# Patient Record
Sex: Female | Born: 1975 | Race: White | Hispanic: No | Smoking: Never smoker
Health system: Southern US, Community
[De-identification: ages and names within clinical notes are randomized; demographics above are authoritative.]

## PROBLEM LIST (undated history)

## (undated) ENCOUNTER — Inpatient Hospital Stay (HOSPITAL_COMMUNITY): Payer: Self-pay

## (undated) DIAGNOSIS — E069 Thyroiditis, unspecified: Secondary | ICD-10-CM

## (undated) DIAGNOSIS — R87629 Unspecified abnormal cytological findings in specimens from vagina: Secondary | ICD-10-CM

## (undated) DIAGNOSIS — N760 Acute vaginitis: Principal | ICD-10-CM

## (undated) DIAGNOSIS — I10 Essential (primary) hypertension: Secondary | ICD-10-CM

## (undated) DIAGNOSIS — T7840XA Allergy, unspecified, initial encounter: Secondary | ICD-10-CM

## (undated) DIAGNOSIS — J309 Allergic rhinitis, unspecified: Secondary | ICD-10-CM

## (undated) DIAGNOSIS — B9689 Other specified bacterial agents as the cause of diseases classified elsewhere: Secondary | ICD-10-CM

## (undated) DIAGNOSIS — R1011 Right upper quadrant pain: Secondary | ICD-10-CM

## (undated) DIAGNOSIS — R319 Hematuria, unspecified: Secondary | ICD-10-CM

## (undated) DIAGNOSIS — K219 Gastro-esophageal reflux disease without esophagitis: Secondary | ICD-10-CM

## (undated) DIAGNOSIS — K5909 Other constipation: Secondary | ICD-10-CM

## (undated) DIAGNOSIS — E042 Nontoxic multinodular goiter: Secondary | ICD-10-CM

## (undated) DIAGNOSIS — E049 Nontoxic goiter, unspecified: Secondary | ICD-10-CM

## (undated) HISTORY — DX: Acute vaginitis: N76.0

## (undated) HISTORY — DX: Thyroiditis, unspecified: E06.9

## (undated) HISTORY — DX: Other specified bacterial agents as the cause of diseases classified elsewhere: B96.89

## (undated) HISTORY — DX: Allergic rhinitis, unspecified: J30.9

## (undated) HISTORY — DX: Unspecified abnormal cytological findings in specimens from vagina: R87.629

## (undated) HISTORY — DX: Nontoxic multinodular goiter: E04.2

## (undated) HISTORY — DX: Essential (primary) hypertension: I10

## (undated) HISTORY — DX: Nontoxic goiter, unspecified: E04.9

## (undated) HISTORY — DX: Allergy, unspecified, initial encounter: T78.40XA

## (undated) HISTORY — DX: Other constipation: K59.09

## (undated) HISTORY — DX: Gastro-esophageal reflux disease without esophagitis: K21.9

## (undated) HISTORY — DX: Hematuria, unspecified: R31.9

## (undated) HISTORY — DX: Right upper quadrant pain: R10.11

---

## 2002-10-07 ENCOUNTER — Ambulatory Visit (HOSPITAL_COMMUNITY): Admission: AD | Admit: 2002-10-07 | Discharge: 2002-10-07 | Payer: Self-pay | Admitting: Obstetrics and Gynecology

## 2002-10-16 ENCOUNTER — Ambulatory Visit (HOSPITAL_COMMUNITY): Admission: AD | Admit: 2002-10-16 | Discharge: 2002-10-16 | Payer: Self-pay | Admitting: Obstetrics and Gynecology

## 2002-10-17 ENCOUNTER — Inpatient Hospital Stay (HOSPITAL_COMMUNITY): Admission: AD | Admit: 2002-10-17 | Discharge: 2002-10-19 | Payer: Self-pay | Admitting: Obstetrics and Gynecology

## 2003-05-13 ENCOUNTER — Ambulatory Visit (HOSPITAL_COMMUNITY): Admission: RE | Admit: 2003-05-13 | Discharge: 2003-05-13 | Payer: Self-pay | Admitting: Urology

## 2004-06-07 HISTORY — PX: ENDOMETRIAL ABLATION: SHX621

## 2004-06-07 HISTORY — PX: TUBAL LIGATION: SHX77

## 2004-12-17 ENCOUNTER — Ambulatory Visit (HOSPITAL_COMMUNITY): Admission: RE | Admit: 2004-12-17 | Discharge: 2004-12-17 | Payer: Self-pay | Admitting: Family Medicine

## 2004-12-18 ENCOUNTER — Ambulatory Visit: Payer: Self-pay | Admitting: Cardiology

## 2005-07-08 ENCOUNTER — Ambulatory Visit (HOSPITAL_COMMUNITY): Admission: RE | Admit: 2005-07-08 | Discharge: 2005-07-08 | Payer: Self-pay | Admitting: Obstetrics and Gynecology

## 2005-09-28 ENCOUNTER — Ambulatory Visit: Payer: Self-pay | Admitting: Internal Medicine

## 2005-10-05 ENCOUNTER — Ambulatory Visit: Payer: Self-pay | Admitting: Internal Medicine

## 2005-10-05 ENCOUNTER — Ambulatory Visit (HOSPITAL_COMMUNITY): Admission: RE | Admit: 2005-10-05 | Discharge: 2005-10-05 | Payer: Self-pay | Admitting: Internal Medicine

## 2006-04-21 ENCOUNTER — Ambulatory Visit (HOSPITAL_COMMUNITY): Admission: RE | Admit: 2006-04-21 | Discharge: 2006-04-21 | Payer: Self-pay | Admitting: Obstetrics and Gynecology

## 2006-07-28 ENCOUNTER — Ambulatory Visit (HOSPITAL_COMMUNITY): Admission: RE | Admit: 2006-07-28 | Discharge: 2006-07-28 | Payer: Self-pay | Admitting: Obstetrics and Gynecology

## 2006-08-18 ENCOUNTER — Encounter: Payer: Self-pay | Admitting: Endocrinology

## 2007-03-03 ENCOUNTER — Ambulatory Visit (HOSPITAL_COMMUNITY): Admission: RE | Admit: 2007-03-03 | Discharge: 2007-03-03 | Payer: Self-pay | Admitting: Urology

## 2008-02-09 ENCOUNTER — Other Ambulatory Visit: Admission: RE | Admit: 2008-02-09 | Discharge: 2008-02-09 | Payer: Self-pay | Admitting: Obstetrics and Gynecology

## 2008-02-26 IMAGING — US US ABDOMEN COMPLETE
1 series · 14 of 25 positions shown · non-contrast
Comparison: none

CLINICAL DATA: Elevated liver function test.
 ABDOMINAL ULTRASOUND:
TECHNIQUE: Complete abdominal ultrasound examination was performed including evaluation of the liver, gallbladder, bile ducts, pancreas, kidneys, spleen, IVC, and abdominal aorta.

[Series 1: unknown · 0.33mm/px · 14 of 69 slices shown]
[im 1/69]
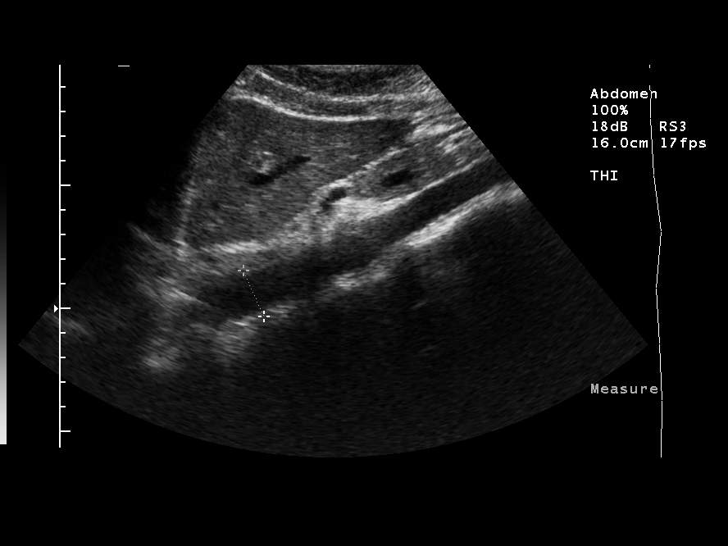
[im 6/69]
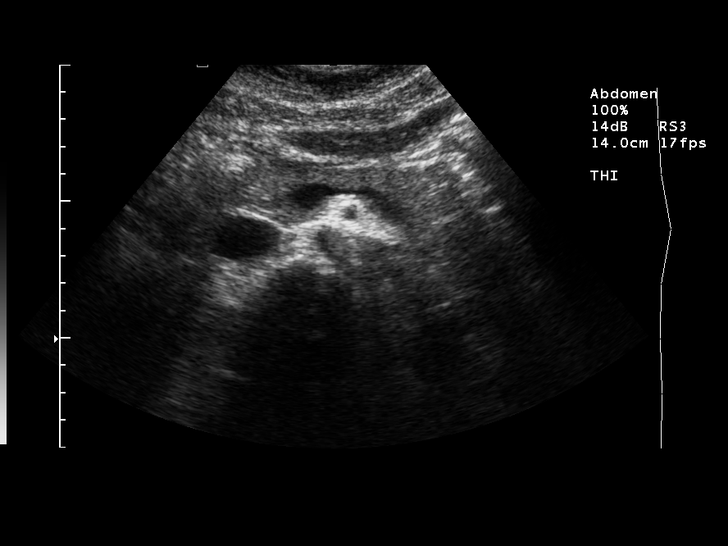
[im 12/69]
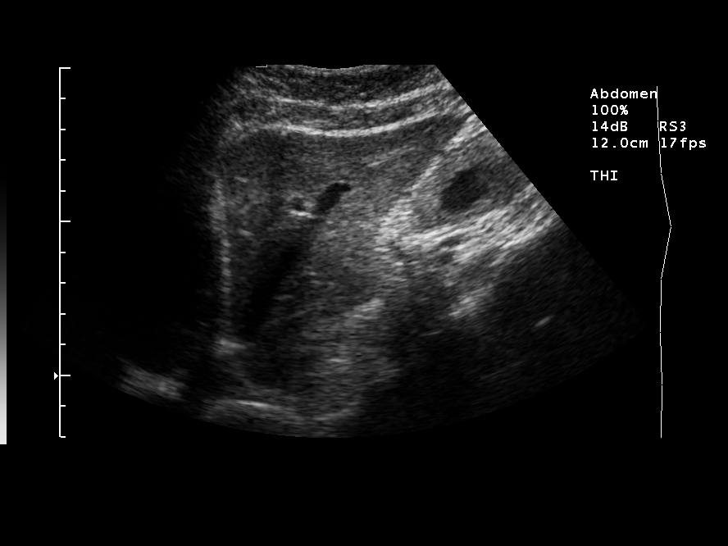
[im 18/69]
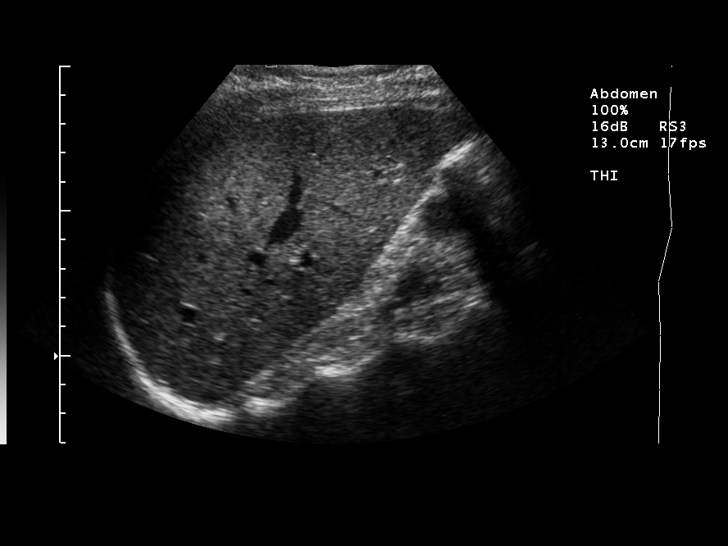
[im 23/69]
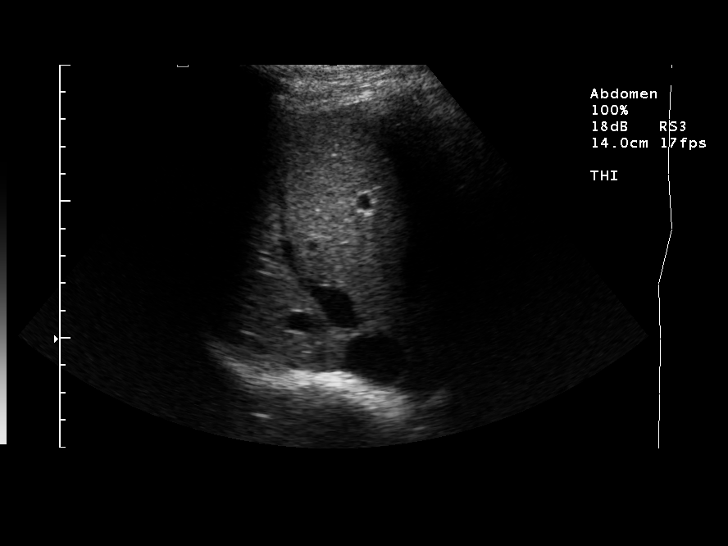
[im 26/69]
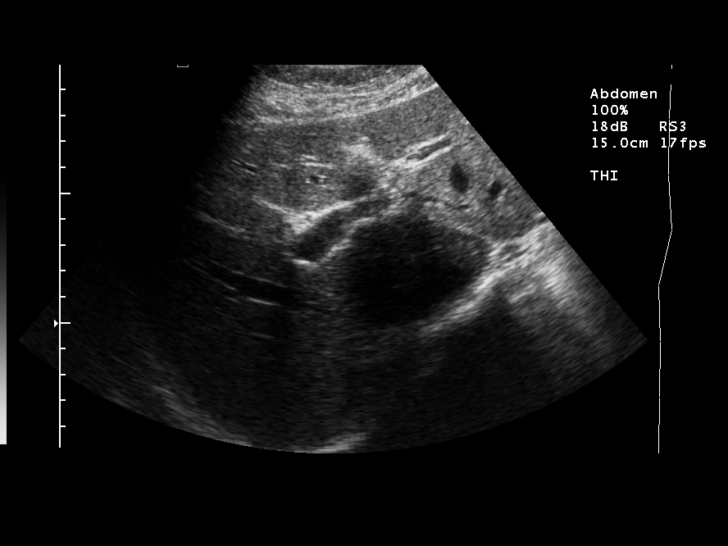
[im 32/69]
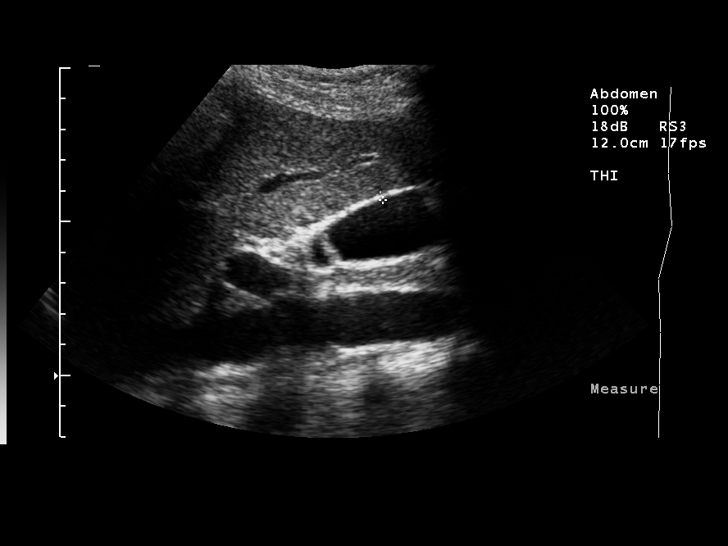
[im 37/69]
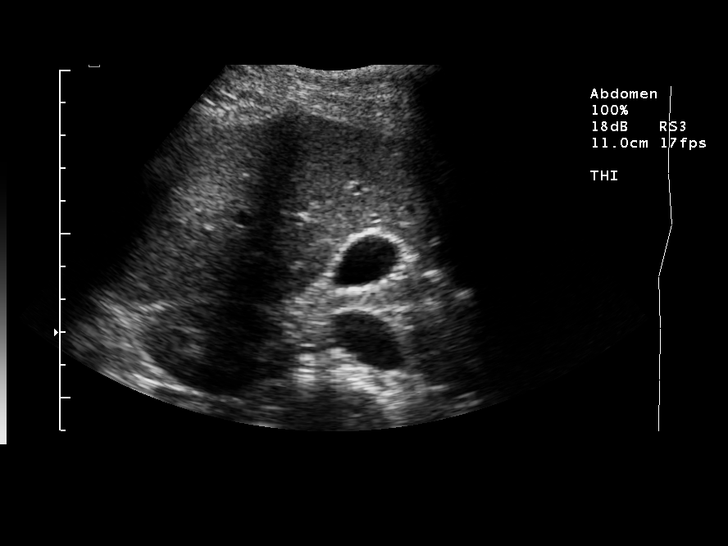
[im 43/69]
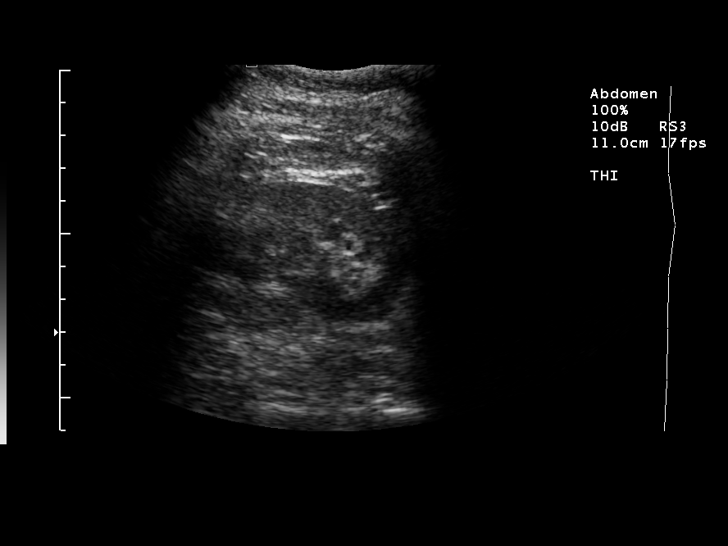
[im 46/69]
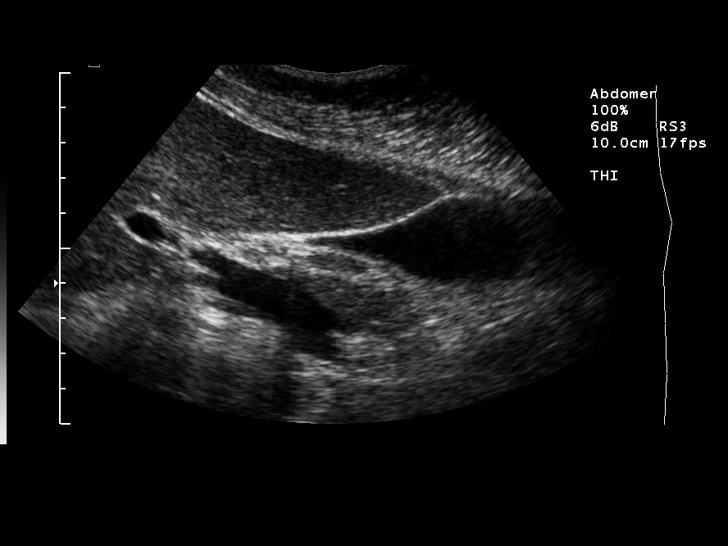
[im 52/69]
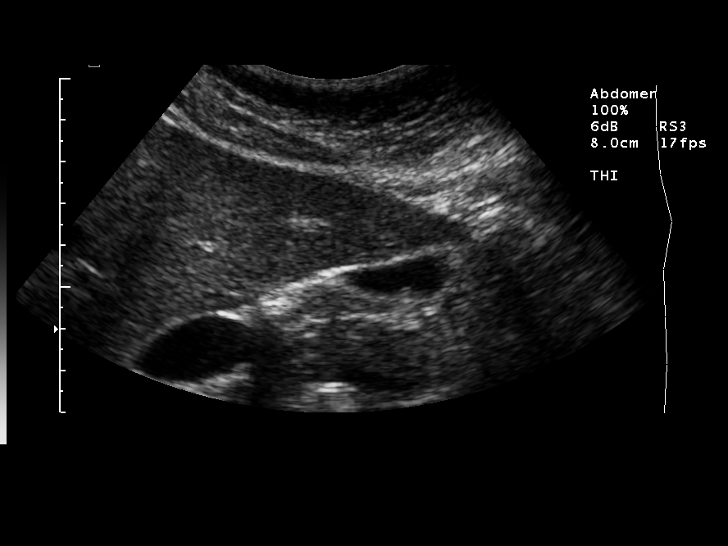
[im 57/69]
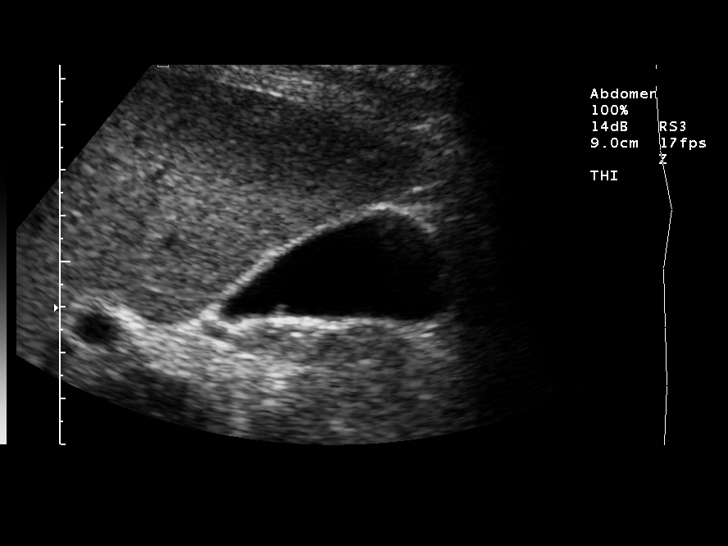
[im 63/69]
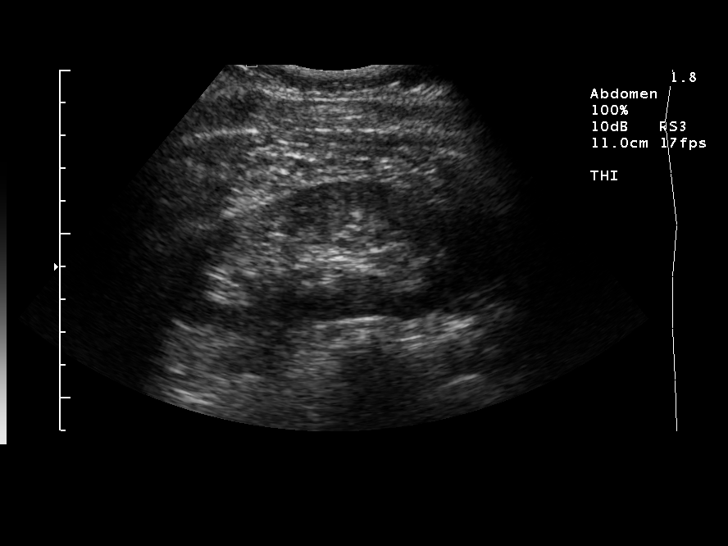
[im 69/69]
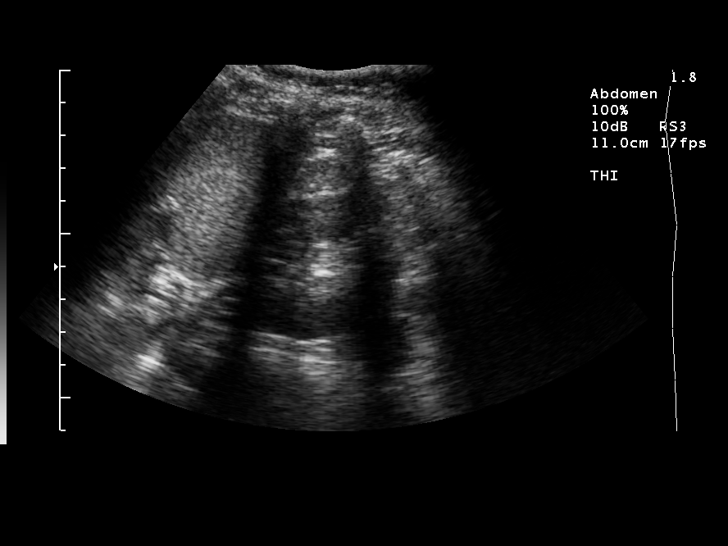

[14 of 25 positions shown; findings below may reference images not displayed]

FINDINGS: There is a small cholesterol polyp within the gallbladder measuring 3mm in diameter.  The gallbladder wall thickness is normal and there is no evidence for cholelithiasis.  There is no fluid surrounding the gallbladder.  The common bile duct is normal in caliber measuring 2mm in diameter.  The liver is normal in size and there are no focal hepatic abnormalities.  The inferior vena cava, pancreas, spleen, kidneys and abdominal aorta have a normal appearance with the right kidney measuring 9.4cm in length and the left kidney 10.6cm in length.  There is no ascites.
IMPRESSION: Small polyp noted within the gallbladder (incidental finding).  Otherwise normal study.

## 2008-09-19 ENCOUNTER — Encounter: Payer: Self-pay | Admitting: Endocrinology

## 2009-11-05 ENCOUNTER — Encounter: Payer: Self-pay | Admitting: Endocrinology

## 2009-11-12 ENCOUNTER — Encounter: Payer: Self-pay | Admitting: Endocrinology

## 2009-11-26 ENCOUNTER — Encounter: Payer: Self-pay | Admitting: Endocrinology

## 2009-12-03 ENCOUNTER — Encounter: Payer: Self-pay | Admitting: Endocrinology

## 2010-06-05 ENCOUNTER — Other Ambulatory Visit
Admission: RE | Admit: 2010-06-05 | Discharge: 2010-06-05 | Payer: Self-pay | Source: Home / Self Care | Admitting: Obstetrics and Gynecology

## 2010-06-11 ENCOUNTER — Other Ambulatory Visit: Payer: Self-pay | Admitting: Endocrinology

## 2010-06-11 ENCOUNTER — Ambulatory Visit
Admission: RE | Admit: 2010-06-11 | Discharge: 2010-06-11 | Payer: Self-pay | Source: Home / Self Care | Attending: Endocrinology | Admitting: Endocrinology

## 2010-06-11 DIAGNOSIS — J309 Allergic rhinitis, unspecified: Secondary | ICD-10-CM

## 2010-06-11 DIAGNOSIS — E069 Thyroiditis, unspecified: Secondary | ICD-10-CM

## 2010-06-11 DIAGNOSIS — E042 Nontoxic multinodular goiter: Secondary | ICD-10-CM | POA: Insufficient documentation

## 2010-06-11 HISTORY — DX: Thyroiditis, unspecified: E06.9

## 2010-06-11 HISTORY — DX: Nontoxic multinodular goiter: E04.2

## 2010-06-11 HISTORY — DX: Allergic rhinitis, unspecified: J30.9

## 2010-06-11 LAB — TSH: TSH: 1.25 u[IU]/mL (ref 0.35–5.50)

## 2010-07-09 NOTE — Letter (Signed)
Summary: French Ana MD  French Ana MD   Imported By: Sherian Rein 06/18/2010 12:42:46  _____________________________________________________________________  External Attachment:    Type:   Image     Comment:   External Document

## 2010-07-09 NOTE — Assessment & Plan Note (Signed)
Summary: NEW ENDO/ASHIMOTOTHYROIDITIS/4 BIOPSIED NODULES/SECOND OPINIO...   Vital Signs:  Patient profile:   35 year old female Height:      64 inches (162.56 cm) Weight:      124.38 pounds (56.54 kg) BMI:     21.43 O2 Sat:      95 % on Room air Temp:     98.8 degrees F (37.11 degrees C) oral Pulse rate:   76 / minute Pulse rhythm:   regular BP sitting:   108 / 72  (left arm) Cuff size:   regular  Vitals Entered By: Brenton Grills CMA Duncan Dull) (June 11, 2010 8:47 AM)  O2 Flow:  Room air CC: New Endo Consult/Hashimoto's Thyroiditis/2nd opinion/Self referral Is Patient Diabetic? No   Primary Provider:  Lubertha South MD  CC:  New Endo Consult/Hashimoto's Thyroiditis/2nd opinion/Self referral.  History of Present Illness: pt was noted by a freind in 2007, to have a goiter.  she was noted on ultrasound to have a multinodular goiter.  she was rx'ed synthroid, but this caused fatigue and tremor.  she stopped it.  she had bx of bilat nodules in 2010.  pt says she can notice the enlarged thyroid. pt states few mos of moderate tremor of the hands, and assoc anxiety.  Current Medications (verified): 1)  Astepro 0.15 % Soln (Azelastine Hcl) .Marland Kitchen.. 1 Puff Into Each Nostril Every Morniing 2)  Nasonex 50 Mcg/act Susp (Mometasone Furoate) .Marland Kitchen.. 1 Puff Into Each Nostril Once Daily 3)  Claritin 10 Mg Tabs (Loratadine) .Marland Kitchen.. 1 Tablet By Mouth Once Daily  Allergies (verified): No Known Drug Allergies  Past History:  Past Medical History: ALLERGIC RHINITIS (ICD-477.9) THYROIDITIS (ICD-245.9) GOITER, MULTINODULAR (ICD-241.1) FAMILY HISTORY DIABETES 1ST DEGREE RELATIVE (ICD-V18.0) FAMILY HISTORY OF ALCOHOLISM/ADDICTION (ICD-V61.41)  Past Surgical History: Tubal Ligation (2006) endometrial ablation (for menorrhagia), also 2006  Family History: Reviewed history and no changes required. Family History of Alcoholism/Addiction Family History Diabetes 1st degree relative Family History  Hypertension Family History Of Stroke (Parent) Family History of Colon Cancer (Grandparent) Family History of Breast Cancer (Grandparent) great aunt had a huge goiter brother had overactive thyroid (?reason, never had rx)  Social History: Reviewed history and no changes required. Never Smoked Alcohol use-no Drug use-no Regular exercise-no Married nurse at [employer]  Smoking Status:  never Drug Use:  no Does Patient Exercise:  no Seat Belt Use:  yes  Review of Systems       denies sob and dysphagia.  she repoorts difficulty with concentration, leg weakness, constipation, rhinorrhea, and easy bruising.  she has lost a few lbs.  she reports intermittent hypoglycemia.  she has short but reg menses.  she denies headache, hoarseness, double vision, palpitations, polyuria, myalgias, excessive diaphoresis, and numbness.  Physical Exam  General:  normal appearance.   Head:  head: no deformity eyes: no periorbital swelling, no proptosis external nose and ears are normal mouth: no lesion seen Neck:  thyroid is slightly enlarged, with irreg surface, but i do not appreciate a discrete nodule Lungs:  Clear to auscultation bilaterally. Normal respiratory effort.  Heart:  Regular rate and rhythm without murmurs or gallops noted. Normal S1,S2.   Msk:  muscle bulk and strength are grossly normal.  no obvious joint swelling.  gait is normal and steady  Extremities:  no deformity no edema Neurologic:  cn 2-12 grossly intact.   readily moves all 4's.   sensation is intact to touch on the feet there is a slight tremor of the hands Skin:  normal texture and temp.  no rash is visible.  not diaphoretic  Cervical Nodes:  No significant adenopathy.  Psych:  Alert and cooperative; normal mood and affect; normal attention span and concentration.   Additional Exam:  outside test results are reviewed:  i reviewed thyroid cytologies from 2011, and multiple ultrasounds.   i also reviewed tft.   tpo and  anti-tg antibodies are pos.     Impression & Recommendations:  Problem # 1:  GOITER, MULTINODULAR (ICD-241.1) usually hereditary  Problem # 2:  THYROIDITIS (ICD-245.9) uncertain how or if this is related to #1.  she may have 2 thyroid diseases  Problem # 3:  hypoglycemia this is a risk fact for diabetes  Medications Added to Medication List This Visit: 1)  Astepro 0.15 % Soln (Azelastine hcl) .Marland Kitchen.. 1 puff into each nostril every morniing 2)  Nasonex 50 Mcg/act Susp (Mometasone furoate) .Marland Kitchen.. 1 puff into each nostril once daily 3)  Claritin 10 Mg Tabs (Loratadine) .Marland Kitchen.. 1 tablet by mouth once daily  Other Orders: TLB-TSH (Thyroid Stimulating Hormone) (84443-TSH) Consultation Level IV (16109)  Patient Instructions: 1)  blood tests are being ordered for you today.  please call [phone] to hear your test results. 2)  most of the time, a "lumpy thyroid" will eventually become overactive.  this is usually a slow process, happening over the span of many years. 3)  however, you also have imflamation of the thyroid, which will probably cause an underactive thyroid, again usually over a period of many years.   4)  therefore, you should have your thyroid blood test checked at least twice a year.  i would be happy to do this, or it could be done by dr Gerda Diss or dr Emelda Fear.   5)  cc drs luking ferguson, and Millard.   Orders Added: 1)  TLB-TSH (Thyroid Stimulating Hormone) [84443-TSH] 2)  Consultation Level IV [81191]   Immunization History:  Tetanus/Td Immunization History:    Tetanus/Td:  historical (06/07/2006)   Immunization History:  Tetanus/Td Immunization History:    Tetanus/Td:  Historical (06/07/2006)

## 2010-07-09 NOTE — Letter (Signed)
Summary: French Ana MD  French Ana MD   Imported By: Sherian Rein 06/18/2010 12:51:03  _____________________________________________________________________  External Attachment:    Type:   Image     Comment:   External Document

## 2010-09-23 ENCOUNTER — Other Ambulatory Visit (HOSPITAL_COMMUNITY): Payer: Self-pay | Admitting: Family Medicine

## 2010-09-23 DIAGNOSIS — M79604 Pain in right leg: Secondary | ICD-10-CM

## 2010-09-23 DIAGNOSIS — R200 Anesthesia of skin: Secondary | ICD-10-CM

## 2010-09-23 DIAGNOSIS — M5430 Sciatica, unspecified side: Secondary | ICD-10-CM

## 2010-09-29 ENCOUNTER — Ambulatory Visit (HOSPITAL_COMMUNITY)
Admission: RE | Admit: 2010-09-29 | Discharge: 2010-09-29 | Disposition: A | Discharge status: Home / Self Care | Payer: BC Managed Care – PPO | Source: Ambulatory Visit | Attending: Family Medicine | Admitting: Family Medicine

## 2010-09-29 DIAGNOSIS — M79604 Pain in right leg: Secondary | ICD-10-CM

## 2010-09-29 DIAGNOSIS — R200 Anesthesia of skin: Secondary | ICD-10-CM

## 2010-09-29 DIAGNOSIS — M5430 Sciatica, unspecified side: Secondary | ICD-10-CM | POA: Insufficient documentation

## 2010-10-23 NOTE — Op Note (Signed)
Kristi, Medina              ACCOUNT NO.:  [id number]   MEDICAL RECORD NO.:  [id number]          PATIENT TYPE:  AMB   LOCATION:  DAY                           FACILITY:  [facility]   PHYSICIAN:  Tilda Burrow, M.D. DATE OF BIRTH:  Nov 10, 1975   DATE OF PROCEDURE:  07/08/2005  DATE OF DISCHARGE:                                 OPERATIVE REPORT   PREOPERATIVE DIAGNOSES:  1.  Desire for elective sterilization.  2.  Menorrhagia.   POSTOPERATIVE DIAGNOSES:  1.  Desire for elective sterilization.  2.  Menorrhagia.   PROCEDURE:  Elective permanent sterilization by Falope ring application,  hysteroscopy, endometrial ablation.   SURGEON:  Christin Bach, M.D.   ASSISTANT:  None.   ANESTHESIA:  General.   COMPLICATIONS:  None.   FINDINGS:  Small uterus, anteflexed, sounded to 7 cm. Normal appearing tubes  and ovaries bilaterally.   DETAILS OF PROCEDURE:  The patient was taken to the operating room, prepped  and draped for combined abdominal and vaginal procedures. In-and-out  catheterization of the bladder was performed, and Hulka tenaculum attached  to the cervix for uterine  manipulation. An infraumbilical, vertical skin  incision was made in the naval, hidden well within the naval, and a Veress  needle used to achieve pneumoperitoneum after water droplet test confirmed  intraperitoneal placement. Particular care was taken to orient the needle  toward the pelvis due to the patient's very slim body build. We achieved  pneumoperitoneum easily with 3 liters CO2, then introduced the 5-mm  laparoscopic trocar through the umbilicus under direct visualization, then  developed a second suprapubic trocar site using an 8-mm trocar. This was  inserted under direct visualization, and attention directed to the pelvis  where photos showed the normal appearing pelvis, with two Falope rings  applied in standard fashion to each tube with Marcaine solution used  percutaneously to inject into  the incarcerated knuckle of tube and into the  underlying broad ligament. Procedure was successfully achieved, and 200 cc  saline instilled into the abdominal cavity to help with deflation of the  abdomen. Subcuticular 4-0 Dexon closure of the skin allowed completion of  the procedure.   We then proceeded to the vaginal portion of the case. At this time, a  Grave's speculum was inserted in the vagina. Cervix grasped with single-  toothed tenaculum, sounded to 7 cm, dilated to 25-French, allowing  introduction of the 23-mm, 30-degree rigid hysteroscope. Hysteroscopy  revealed normal uterine internal contours without endometrial thickening or  any polyps. Curettage was not considered necessary. Endometrial ablation was  then performed using  the [employer]ne[employer] ThermaChoice balloon using a total of 12 cc of fluid in a  standard 8-minute heating cycle. All of the fluid retrieved after the  cooling cycle completed, and the patient then allowed to waken and go to the  recovery room in good condition. Sponge and needle counts were correct  throughout.      Tilda Burrow, M.D.  Electronically Signed     JVF/MEDQ  D:  07/09/2005  T:  07/10/2005  Job:  [id number]

## 2010-10-23 NOTE — H&P (Signed)
NAME:  Kristi Medina, Kristi Medina                        ACCOUNT NO.:  [id number]   MEDICAL RECORD NO.:  [id number]                   PATIENT TYPE:   LOCATION:                                       FACILITY:  [facility]   PHYSICIAN:  Tilda Burrow, M.D.              DATE OF BIRTH:  January 05, 1976   DATE OF ADMISSION:  10/17/2002  DATE OF DISCHARGE:                                HISTORY & PHYSICAL   REASON FOR ADMISSION:  Pregnancy at 38 weeks for induction of labor.   HISTORY OF PRESENT ILLNESS:  The patient is a 35 year old, gravida 2, para  1, with an EDC of Nov 01, 2002, who is being followed in the office with  lack of growth starting at 34 weeks and a bout of preterm labor.  She has  had little growth during the course of the past five to six weeks.  She has  also within the past two weeks had a decrease in amniotic fluid volume from  16.9 to 12.8 and increasing calcification in the placenta and decreasing  fetal movement, but a reassuring NST.  After discussing the risk of  respiratory distress syndrome due to prematurity in a 38-week infant and all  of the other risks and benefits and weighing all of the pros and cons, the  patient feels strongly about being delivered, so we are going to put her in  for Pitocin induction of labor.   PAST MEDICAL HISTORY:  Positive for chronic hematuria.   PAST SURGICAL HISTORY:  Negative.   ALLERGIES:  She has seasonal allergies.   MEDICATIONS:  She takes Zyrtec, Tylenol, and prenatal vitamins.   SOCIAL HISTORY:  She is married.  She is a Engineer, civil (consulting) at [employer].  Her  husband is supportive and they have a stable relationship.   FAMILY HISTORY:  Positive for diabetes, hypertension, stroke, breast cancer,  and colon rupture.   PRENATAL COURSE:  Essentially uneventful up until April when she had  premature labor and premature cervical dilatation.  At that time, she was  given betamethasone 12 mg IM in 24-hour increments x 2 doses.  Her blood  type is A positive.  Rubella is immune.  Hepatitis B surface negative.  HIV  is negative.  Serology is nonreactive.  Pap is within normal limits.  GC and  chlamydia are negative.  She declined AFP.  GBS is negative.  The 28-week  hemoglobin was 11.6, hematocrit 35.7, and one-hour glucose 37.   PHYSICAL EXAMINATION:  WEIGHT:  155 pounds.  VITAL SIGNS:  Blood pressure 124/72.  ABDOMEN:  The fundal height is approximately 32 cm.  Fetal movements are  decreased, but NST is reassuring.  PELVIC:  The cervix is 2 cm, 90% effaced, and 0 station.   PLAN:  We are going to admit for Pitocin induction of labor.     Zerita Boers, N.M.  Tilda Burrow, M.D.    DL/MEDQ  D:  16/03/9603  T:  10/16/2002  Job:  [id number]   cc:   [facility]

## 2010-10-23 NOTE — Op Note (Signed)
   NAME:  Kristi Medina, Kristi Medina                        ACCOUNT NO.:  [id number]   MEDICAL RECORD NO.:  [id number]                   PATIENT TYPE:  INP   LOCATION:  LDR1                                 FACILITY:  [facility]   PHYSICIAN:  Tilda Burrow, M.D.              DATE OF BIRTH:  02/17/1976   DATE OF PROCEDURE:  DATE OF DISCHARGE:                                  PROCEDURE NOTE   ONSET OF LABOR:  Oct 17, 2002.   LENGTH OF FIRST STAGE OF LABOR:  1 hour and 45 minutes.   LENGTH OF SECOND STAGE OF LABOR:  38 minutes.   LENGTH OF THIRD STAGE OF LABOR:  7 minutes.   DELIVERY NOTE:  The patient had a normal spontaneous vaginal delivery of a  viable female infant weighing 5 pounds 6 ounces under epidural anesthesia.  Upon delivery of the head, the infant was rotated and delivered easily,  thoroughly suctioned, and dried.  The cord was clamped and cut.  Placed on  the mother's abdomen in good condition with good movement of all  extremities, a vigorous cry, and color was pink.  The third stage of labor  was actively managed with 20 units of Pitocin and 1000 mL of LR and  prophylaxis of Hemabate 0.5 amp IM for uterine atony.  The estimated blood  loss was approximately 350 mL.  The placenta was delivered spontaneously via  Schultz's mechanism.  A three-vessel cord was noted upon inspection.  Membranes were noted to be intact upon inspection.  The placenta was very  aged with many calcifications.  The placenta was sent to pathology for  inspection.  The mother and infant tolerated the procedure well.  The  epidural catheter was removed with the blue tip intact.  The patient and  infant were both stabilized and transferred out to the postpartum unit in  good condition.     Zerita Boers, Reita Cliche, M.D.    DL/MEDQ  D:  16/03/9603  T:  10/17/2002  Job:  [id number]   cc:   [facility]

## 2010-10-23 NOTE — H&P (Signed)
NAMEJENEVIEVE, Kristi Medina              ACCOUNT NO.:  [id number]   MEDICAL RECORD NO.:  [id number]          PATIENT TYPE:  AMB   LOCATION:  DAY                           FACILITY:  [facility]   PHYSICIAN:  Tilda Burrow, M.D. DATE OF BIRTH:  07/19/1975   DATE OF ADMISSION:  07/08/2005  DATE OF DISCHARGE:  LH                                HISTORY & PHYSICAL   ADMISSION DIAGNOSES:  Desire for elective sterilization, menorrhagia,  scheduled for tubal ligation and endometrial ablation.   HISTORY OF PRESENT ILLNESS:  This 35 year old female gravida 2, para 2, LMP  June 16, 2005 is admitted at this time for endometrial ablation and tubal  sterilization. Kristi Medina has two children, 11 and 2, no plans for future child  bearing. She has 7-day menses which are a source of discomfort, headaches,  when not on birth control pills. Depo-Provera caused unacceptable weight  gain. Birth control pills resulted in headache. Nuva ring was attempted  resulted in chronic cervicitis. There is no desire for more children.  Resolution of menses is an important part of her desired treatment.  Laparoscopic tubal sterilization is reviewed with failure rate of 1 in 100  quoted to the patient. Endometrial ablation is reviewed. The patient was  medically knowledgeable and completely familiar with the procedure.   PAST MEDICAL HISTORY:  Positive for chronic microscopic hematuria.   PAST SURGICAL HISTORY:  Negative.   ALLERGIES:  Seasonal allergies only.   MEDICATIONS:  Zyrtec p.r.n.   PHYSICAL EXAMINATION:  GENERAL:  Shows a healthy-appearing, slim, Caucasian  female. Weight 129, blood pressure 117/70.  HEENT:  Pupils are equal, round, and reactive. Extraocular movements intact.  NECK:  Supple.  CHEST:  Clear to auscultation.  ABDOMEN:  Nontender.  EXTERNAL GENITALIA:  Multiparous.  VAGINA:  Normal secretions.  CERVIX:  Recent Pap smear Class I August 2006.   PLAN:  Hysteroscopy D&C, endometrial ablation, with  tubal sterilization to  be performed July 08, 2005 using ThermaChoice balloon endometrial  ablation device.      Tilda Burrow, M.D.  Electronically Signed     JVF/MEDQ  D:  07/01/2005  T:  07/01/2005  Job:  [id number]   cc:   [facility]  Fax: [phone]

## 2010-10-23 NOTE — Consult Note (Signed)
NAME:  Kristi Medina, Kristi Medina              ACCOUNT NO.:  [id number]   MEDICAL RECORD NO.:  [id number]          PATIENT TYPE:  AMB   LOCATION:  DAY                           FACILITY:  [facility]   PHYSICIAN:  R. Roetta Sessions, M.D. DATE OF BIRTH:  04-08-1976   DATE OF CONSULTATION:  09/28/2005  DATE OF DISCHARGE:  07/08/2005                                   CONSULTATION   REASON FOR CONSULTATION:  Hemoccult-positive stool.   HISTORY OF PRESENT ILLNESS:  Ms. 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  Incidentally, she checked her hemoglobin at the  office.  She was found to have a hemoglobin of 11.3 about two months ago.  She then took home four Hemoccult cards, all of which were positive.  She  then scheduled an appointment to be seen by [place].  In the interim, she  developed nausea, vomiting, and watery diarrhea for about four days last  week.  However, prior to the Hemoccult, she denies any problems with this.  This was self limited.  She has had no further abdominal pain, nausea,  vomiting, or diarrhea.  She does complain of occasional dull ache  retrosternally.  She has felt the problems could be related to her heart.  However, she had an EKG which showed PACs.  She denies any gross rectal  bleeding or melena.  She generally has a soft brown bowel movement once or  twice a day.  Denies any problems with chronic constipation or diarrhea.  She denies any history of hemorrhoids, proctalgia, pruritus.  She denies any  heartburn, indigestion, dysphagia, or odynophagia.  Denies any anorexia or  early satiety.   Hemoglobin from September 14, 2005, was 14.4, hematocrit 45.4, platelets 188,  and WBC 5.2.  Sodium 139, potassium 4.1, chloride 103, CO2 28, glucose 81,  BUN 18, creatinine 0.8.  Total bilirubin 0.4.  Alkaline phosphatase 55.  AST  elevated at 38, ALT elevated at 80.  Total protein 7.9, albumin 4.8, calcium  10.1.  Previously, her liver function tests have been  normal except for  mildly elevated ALT at 55 on August 25, 2005.  She had a H. pylori serology  which was negative.  She reports to me that she had a positive hepatitis A  antibody, hepatitis B immunity, and negative HCV antibody through Dr.  Rayna Sexton office.  She denies any history of jaundice, clay-colored stools,  darkening of her urine, or pruritus.   PAST MEDICAL HISTORY:  Chronic microscopic hematuria, tubal ligation,  endometrial ablation in February 2007, PACs.   CURRENT MEDICATIONS:  Multivitamin daily, ibuprofen rarely.   ALLERGIES:  No known drug allergies.   FAMILY HISTORY:  Positive for maternal grandmother diagnosed with colon  cancer in her 50s.  Mother age 85 with history of adenomatous polyps.  Father deceased at age 43 secondary to complications of diabetes mellitus.  She has multiple siblings, one with history significant for hypertension.   SOCIAL HISTORY:  Ms. Lomeli has been married for 11 years.  She has a 67-  year-old and an 35 year old who reside at home.  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.  She denies any tobacco, alcohol, or drug  use.   REVIEW OF SYSTEMS:  CONSTITUTIONAL:  Weight has been stable.  Denies any  fever or chills.  Denies any anorexia.  She does complain of some fatigue.  CARDIOVASCULAR:  Denies any chest pain or palpitation.  RESPIRATORY:  Denies  any cough or hemoptysis.  GI:  See HPI.  ENDOCRINE:  She denies any history  of thyroid disease or diabetes mellitus.  HEMATOLOGICAL:  She has had some  easy bruising.  Denies any epistaxis or history of bleeding disorders.  She  does note she had bleeding after her endometrial ablation and was seen by  Dr. Emelda Fear.  She had mild bleeding, two episodes since the ablation.  He,  apparently, has done some workup regarding bleeding disorders which have all  come back negative per her report.   PHYSICAL EXAMINATION:  VITAL SIGNS:  Weight 126 pounds.  Height 64 inches.  Temp  98.2, blood pressure 114/66, pulse 84.  GENERAL:  Ms. Candella is a 35 year old Caucasian female who is alert,  oriented, cooperative, in no acute distress.  HEENT:  Head normocephalic and atraumatic.  Oropharynx clear with moist  without lesions.  NECK:  Supple without mass or thyromegaly.  CHEST:  Heart regular rate and rhythm.  Normal S1 and S2 without murmurs,  rubs, or gallops.  LUNGS:  Clear to auscultation bilaterally.  ABDOMEN:  Positive bowel sounds x 4.  No bruits auscultated.  Soft,  nontender, nondistended without palpable mass or hepatosplenomegaly.  No  rebound, tenderness, or guarding.  RECTAL:  Deferred.  EXTREMITIES:  Without clubbing or edema bilaterally.  SKIN:  Warm and dry without any rash or jaundice.   ASSESSMENT:  Ms. Donlan is a 35 year old Caucasian female who was  recently found to have Hemoccult-positive stool on four cards through Dr.  Rayna Sexton office.  She denies any gross GI bleeding.  Denies any long-  standing GI complaints including heartburn, indigestion, nausea, vomiting,  anorexia, rectal bleeding, constipation or diarrhea.  She has had a mild  fleeting, retrosternal, aching, atypical chest pain intermittently.  I  suspect her bleeding is due to benign anorectal source such as internal  hemorrhoids.  She also could have a silent peptic ulcer disease.  She has a  family history of adenomatous polyps and maternal grandmother with colon  cancer.  Therefore, I feel we should proceed with colonoscopy plus/minus EGD  for further evaluation.   She is also noted to have new-onset transaminitis.  She had a mildly  elevated ALT of 55 a month ago and is twice normal currently.  She also has  had an elevation in her AST at 38.  She has had hepatitis panel which showed  a positive hepatitis A previous exposure and negative for hepatitis B and C.  She is going to need an ultrasound to evaluate her liver and further workup based on trend of her LFTs.    PLAN:  1.  Abdominal ultrasound.  2.  LFTs rechecked May 03/2006, through Dr. Rayna Sexton office.  3.  Colonoscopy plus EGD with Dr. Jena Gauss in the near future.  I have      discussed the procedure, risks and benefits      which include but not limited to bleeding, infection, perforation, drug      reaction.  She agrees and consent will be obtained.   We would like to thank Dr. Emelda Fear  for allowing [place] to participate in the  care of Ms. Pederson.      Nicholas Lose, N.P.      Jonathon Bellows, M.D.  Electronically Signed    KC/MEDQ  D:  09/28/2005  T:  09/29/2005  Job:  [id number]   cc:   Tilda Burrow, M.D.  Fax: [phone]   Mila Homer. Sherlean Foot, M.D.  Fax: [phone]

## 2010-10-23 NOTE — Op Note (Signed)
NAME:  Kristi Medina, Kristi Medina              ACCOUNT NO.:  [id number]   MEDICAL RECORD NO.:  [id number]          PATIENT TYPE:  AMB   LOCATION:  DAY                           FACILITY:  [facility]   PHYSICIAN:  R. Roetta Sessions, M.D. DATE OF BIRTH:  Sep 14, 1975   DATE OF PROCEDURE:  10/05/2005  DATE OF DISCHARGE:                                 OPERATIVE REPORT   PROCEDURE:  Colonoscopy with ileoscopy, diagnostic, followed by  esophagogastroduodenoscopy, diagnostic.   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  There is no family history of colorectal cancer neoplasia.  Colonoscopy is now being done.  Ms. [name] understands that if  colonoscopy is unrevealing, we will perform an EGD.  Risks, benefits and  alternatives have been reviewed and questions answered.  She is agreeable.  Please see documentation on the medical record.   PROCEDURE NOTE:  O2 saturation, blood pressure, pulses, and respirations  were monitored throughout both procedures.  Conscious sedation with Versed 6  mg IV and Demerol 125 mg IV in divided doses.  Phenergan 12.5 mg IV.  Cetacaine spray for topical oropharyngeal anesthesia.   INSTRUMENT:  Olympus video chip system.   FINDINGS:  Digital rectal exam revealed no abnormalities.   ENDOSCOPIC FINDINGS:  Prep was good.   RECTUM:  Examination of the rectal mucosa, including retroflexion of the  anal verge, revealed no abnormalities.   COLON:  The colonic mucosa was surveyed from the rectosigmoid junction  through the left transverse, right colon, the appendiceal orifice, the  ileocecal valve, and cecum.  These structures were well seen and  photographed for the record.  The terminal ileum was intubated to 10 cm.  From this level, the scope was slowly withdrawn.  All previously mentioned  mucosal surfaces were again seen.  The colonic mucosa appeared  entirely  normal, as did the terminal ileum mucosa.  The patient tolerated the  procedure well and was prepared for EGD.   FINDINGS:  Examination of the tubular esophagus revealed no mucosal  abnormalities.  The EG junction was easily traversed.   STOMACH:  The gastric cavity was empty and insufflated well with air.  A  thorough examination of the gastric mucosa, including retroflexed view of  the proximal stomach and esophagogastric junction, demonstrated multiple  areas of the erosions and actually __________ geographic distribution along  the greater curvature, please see photos.  There is no frank ulcer or  infiltrating process, but there was some small adherent clot on these  erosions.  Please see photos.  The pylorus is patent and easily traversed.  Examination of the bulb and second portion revealed no abnormalities.   THERAPEUTICS/DIAGNOSTIC MANEUVERS PERFORMED:  None.   Patient tolerated the procedure well and was reactive.   ENDOSCOPY IMPRESSION:  1.  Normal rectum, terminal ileum.  2.  Esophagogastroduodenoscopy findings:  Normal esophagus.  Multiple      hemorrhagic erosions in the stomach (most likely nonsteroidal-related),      otherwise normal gastric mucosa, normal D1/D2.  The patient's hemoccult positive stools are almost certainly related to  NSAIDs insult to her stomach.  Occult lesions further down in the small  bowel related to NSAIDs are not excluded at this time.   RECOMMENDATIONS:  1.  Stop all forms of NSAIDs, including ibuprofen, etc.  2.  H. pylori serologies.  3.  Separate issue.  The patient has a history of transaminitis.  Will      evaluate further.  Will have her come back to see [place] in three months and      will check a CBC and LFTs just prior to her office visit.      Jonathon Bellows, M.D.  Electronically Signed     RMR/MEDQ  D:  10/05/2005  T:  10/05/2005  Job:  [id number]   cc:   Tilda Burrow, M.D.  Fax: [phone]

## 2010-10-23 NOTE — Op Note (Signed)
   NAME:  Kristi Medina, Kristi Medina                        ACCOUNT NO.:  [id number]   MEDICAL RECORD NO.:  [id number]                   PATIENT TYPE:  OIB   LOCATION:  A414                                 FACILITY:  [facility]   PHYSICIAN:  Lazaro Arms, M.D.                DATE OF BIRTH:  Oct 15, 1975   DATE OF PROCEDURE:  10/17/2002  DATE OF DISCHARGE:  10/16/2002                                 OPERATIVE REPORT   PROCEDURE:  Epidural placement.   INDICATIONS FOR PROCEDURE:  Joline is a gravida 2, para 1, [redacted] weeks gestation,  asking for an epidural be placed at 6 cm.   DESCRIPTION OF PROCEDURE:  The patient was placed in a sitting position.  The iliac crests were identified, spinous processes were identified and  marked.  The L2-L3, and L3-L4 interspaces were marked.  Betadine prep was  used; 1% lidocaine was injected at the L2-L3 interspace.  A 17-gauge Touhy  needle was used and with 1 pass an epidural was placed without difficulty.  The epidural catheter was put into a depth of 5 cm, or 10 cm at the skin.  She was given 10 cc of 0.125% bupivacaine test dose.  That was followed  another 10 cc to dose it up and followed by the continuous pump at 12 cc an  hour.  The patient was given an additional 5 cc of 1.5% lidocaine with  epinephrine to get her level up quickly.  The patient tolerated it well and  was comfortable after the procedure.                                               Lazaro Arms, M.D.    Loraine Maple  D:  10/25/2002  T:  10/25/2002  Job:  [id number]

## 2010-11-19 ENCOUNTER — Other Ambulatory Visit: Payer: Self-pay | Admitting: Neurology

## 2010-11-19 DIAGNOSIS — R202 Paresthesia of skin: Secondary | ICD-10-CM

## 2010-11-19 DIAGNOSIS — R200 Anesthesia of skin: Secondary | ICD-10-CM

## 2010-11-27 ENCOUNTER — Ambulatory Visit (HOSPITAL_COMMUNITY)
Admission: RE | Admit: 2010-11-27 | Discharge: 2010-11-27 | Disposition: A | Discharge status: Home / Self Care | Payer: BC Managed Care – PPO | Source: Ambulatory Visit | Attending: Neurology | Admitting: Neurology

## 2010-11-27 DIAGNOSIS — R209 Unspecified disturbances of skin sensation: Secondary | ICD-10-CM | POA: Insufficient documentation

## 2010-11-27 DIAGNOSIS — J320 Chronic maxillary sinusitis: Secondary | ICD-10-CM | POA: Insufficient documentation

## 2010-11-27 DIAGNOSIS — R200 Anesthesia of skin: Secondary | ICD-10-CM

## 2010-11-27 DIAGNOSIS — R202 Paresthesia of skin: Secondary | ICD-10-CM

## 2010-11-27 DIAGNOSIS — J322 Chronic ethmoidal sinusitis: Secondary | ICD-10-CM | POA: Insufficient documentation

## 2010-11-27 MED ORDER — GADOBENATE DIMEGLUMINE 529 MG/ML IV SOLN
10.0000 mL | Freq: Once | INTRAVENOUS | Status: AC | PRN
  Administered 2010-11-27: 10 mL via INTRAVENOUS

## 2010-12-07 ENCOUNTER — Other Ambulatory Visit (INDEPENDENT_AMBULATORY_CARE_PROVIDER_SITE_OTHER): Payer: BC Managed Care – PPO

## 2010-12-07 ENCOUNTER — Encounter: Payer: Self-pay | Admitting: Endocrinology

## 2010-12-07 ENCOUNTER — Ambulatory Visit (INDEPENDENT_AMBULATORY_CARE_PROVIDER_SITE_OTHER): Payer: BC Managed Care – PPO | Admitting: Endocrinology

## 2010-12-07 VITALS — BP 108/70 | HR 73 | Temp 98.7°F | Ht 64.0 in | Wt 125.1 lb

## 2010-12-07 DIAGNOSIS — E042 Nontoxic multinodular goiter: Secondary | ICD-10-CM

## 2010-12-07 LAB — TSH: TSH: 1.44 u[IU]/mL (ref 0.35–5.50)

## 2010-12-07 NOTE — Patient Instructions (Addendum)
blood tests are being ordered for you today.  please call [phone] to hear your test results.  You will be prompted to enter the 9-digit "MRN" number that appears at the top left of this page, followed by #.  Then you will hear the message. Please make a follow-up appointment in 6 months. 

## 2010-12-07 NOTE — Progress Notes (Signed)
  Subjective:    Patient ID: Kristi Medina, female    DOB: 03-18-1976, 35 y.o.   MRN: [id number]  HPI Pt returns for f/u of thyroiditis and multinodular goiter.  pt states she feels well in general, except for slight tingling of the legs.  Past Medical History  Diagnosis Date  . GOITER, MULTINODULAR 06/11/2010  . THYROIDITIS 06/11/2010  . ALLERGIC RHINITIS 06/11/2010    Past Surgical History  Procedure Date  . Tubal ligation 2006  . Endometrial ablation 2006    for menorrhagia    History   Social History  . Marital Status: Married    Spouse Name: N/A    Number of Children: N/A  . Years of Education: N/A   Occupational History  . Nurse [employer]   Social History Main Topics  . Smoking status: Never Smoker   . Smokeless tobacco: Not on file  . Alcohol Use: No  . Drug Use: No  . Sexually Active:    Other Topics Concern  . Not on file   Social History Narrative   Regular exercise-no    Current Outpatient Prescriptions on File Prior to Visit  Medication Sig Dispense Refill  . DISCONTD: azelastine (ASTELIN) 137 MCG/SPRAY nasal spray Place 1 spray into the nose every morning. Use in each nostril as directed       . DISCONTD: loratadine (CLARITIN) 10 MG tablet Take 10 mg by mouth daily.        Marland Kitchen DISCONTD: mometasone (NASONEX) 50 MCG/ACT nasal spray Place 1 spray into the nose daily.          No Known Allergies  Family History  Problem Relation Age of Onset  . Thyroid disease Brother     had overactive thyroid  . Goiter Other     [employer]  . Alcohol abuse Other   . Diabetes Other     1st degree Relative  . Hypertension Other   . Stroke Other     Parent  . Cancer Other     Breast Cancer-Grandparent  . Cancer Other     Colon Cancer-Grandparent    BP 108/70  Pulse 73  Temp(Src) 98.7 F (37.1 C) (Oral)  Ht 5\' 4"  (1.626 m)  Wt 125 lb 1.9 oz (56.754 kg)  BMI 21.48 kg/m2  SpO2 97%  LMP 11/18/2010    Review of Systems Denies weight change.     Objective:    Physical Exam GENERAL: no distress Thyroid: lightly prominent on the left, with ? Of 1 cm nodule at the mid-left lobe     Assessment & Plan:  With these 2 thyroid probs, she is at risk for either hyper- or hypothyroidism, over time

## 2011-06-14 ENCOUNTER — Encounter: Payer: Self-pay | Admitting: Endocrinology

## 2011-06-14 ENCOUNTER — Other Ambulatory Visit (INDEPENDENT_AMBULATORY_CARE_PROVIDER_SITE_OTHER): Payer: BC Managed Care – PPO

## 2011-06-14 ENCOUNTER — Ambulatory Visit (INDEPENDENT_AMBULATORY_CARE_PROVIDER_SITE_OTHER): Payer: BC Managed Care – PPO | Admitting: Endocrinology

## 2011-06-14 VITALS — BP 102/74 | HR 77 | Temp 97.9°F | Ht 63.5 in | Wt 126.0 lb

## 2011-06-14 DIAGNOSIS — E069 Thyroiditis, unspecified: Secondary | ICD-10-CM

## 2011-06-14 NOTE — Progress Notes (Signed)
  Subjective:    Patient ID: Kristi Medina, female    DOB: 1976/03/05, 36 y.o.   MRN: [id number]  HPI Pt returns for f/u of thyroiditis and multinodular goiter (2007).  she was rx'ed synthroid, but this caused fatigue and tremor.  she stopped it.  she had bx of bilat nodules in 2010.  pt states she feels well in general, except for slight constipation.  She does not notice the nodules.   Past Medical History  Diagnosis Date  . GOITER, MULTINODULAR 06/11/2010  . THYROIDITIS 06/11/2010  . ALLERGIC RHINITIS 06/11/2010    Past Surgical History  Procedure Date  . Tubal ligation 2006  . Endometrial ablation 2006    for menorrhagia    History   Social History  . Marital Status: Married    Spouse Name: N/A    Number of Children: N/A  . Years of Education: N/A   Occupational History  . Nurse [employer]   Social History Main Topics  . Smoking status: Never Smoker   . Smokeless tobacco: Not on file  . Alcohol Use: No  . Drug Use: No  . Sexually Active:    Other Topics Concern  . Not on file   Social History Narrative   Regular exercise-no    Current Outpatient Prescriptions on File Prior to Visit  Medication Sig Dispense Refill  . fexofenadine (ALLEGRA) 180 MG tablet Take 180 mg by mouth daily.          No Known Allergies  Family History  Problem Relation Age of Onset  . Thyroid disease Brother     had overactive thyroid  . Goiter Other     [employer]  . Alcohol abuse Other   . Diabetes Other     1st degree Relative  . Hypertension Other   . Stroke Other     Parent  . Cancer Other     Breast Cancer-Grandparent  . Cancer Other     Colon Cancer-Grandparent    BP 102/74  Pulse 77  Temp(Src) 97.9 F (36.6 C) (Oral)  Ht 5' 3.5" (1.613 m)  Wt 126 lb (57.153 kg)  BMI 21.97 kg/m2  SpO2 98%  LMP 06/07/2011  Review of Systems Denies weight change.      Objective:   Physical Exam VITAL SIGNS:  See vs page GENERAL: no distress Thyroid:  There are 2 palpable  nodules.  One at the mid-right, and 1 at the upper left.       Assessment & Plan:  Multinodular goiter, in a pt with h/o high-titer antibodies.  She is at risk for hyper- or hypothyroidism (probably the latter). She declines f/u ultrasound for now.

## 2011-06-14 NOTE — Patient Instructions (Addendum)
blood tests are being ordered for you today.  please call [phone] to hear your test results.  You will be prompted to enter the 9-digit "MRN" number that appears at the top left of this page, followed by #.  Then you will hear the message. Please make a follow-up appointment in 1 year.   Cc dr Drucie Ip.

## 2011-06-24 ENCOUNTER — Other Ambulatory Visit: Payer: Self-pay | Admitting: Adult Health

## 2011-06-24 DIAGNOSIS — N6320 Unspecified lump in the left breast, unspecified quadrant: Secondary | ICD-10-CM

## 2011-07-14 ENCOUNTER — Other Ambulatory Visit: Payer: Self-pay | Admitting: Adult Health

## 2011-07-14 ENCOUNTER — Ambulatory Visit (HOSPITAL_COMMUNITY)
Admission: RE | Admit: 2011-07-14 | Discharge: 2011-07-14 | Disposition: A | Discharge status: Home / Self Care | Payer: BC Managed Care – PPO | Source: Ambulatory Visit | Attending: Adult Health | Admitting: Adult Health

## 2011-07-14 DIAGNOSIS — N63 Unspecified lump in unspecified breast: Secondary | ICD-10-CM | POA: Insufficient documentation

## 2011-07-14 DIAGNOSIS — N6320 Unspecified lump in the left breast, unspecified quadrant: Secondary | ICD-10-CM

## 2012-05-26 ENCOUNTER — Encounter (INDEPENDENT_AMBULATORY_CARE_PROVIDER_SITE_OTHER): Payer: Self-pay | Admitting: General Surgery

## 2012-05-26 ENCOUNTER — Ambulatory Visit (INDEPENDENT_AMBULATORY_CARE_PROVIDER_SITE_OTHER): Payer: BC Managed Care – PPO | Admitting: General Surgery

## 2012-05-26 VITALS — BP 96/62 | HR 85 | Temp 98.0°F | Ht 63.75 in | Wt 118.8 lb

## 2012-05-26 DIAGNOSIS — N6019 Diffuse cystic mastopathy of unspecified breast: Secondary | ICD-10-CM

## 2012-05-26 NOTE — Patient Instructions (Signed)

## 2012-05-26 NOTE — Progress Notes (Signed)
Patient ID: Kristi Medina, female   DOB: 1976-02-04, 36 y.o.   MRN: [id number]  Chief Complaint  Patient presents with  . Pre-op Exam    eval Lt br cyst    HPI Kristi Medina is a 36 y.o. female.  Referred by Dr. Emelda Fear HPI This is a 36 year old female who is otherwise very healthy who presents with a history of left breast tenderness. She has a family history her maternal grandmother breast cancer. She has had no prior history of any breast problems herself. 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 These areas have been present for about a year. She thinks they are 2 left breast cysts one of which is at 9:00 and one of which is about 6:00. She has undergone an evaluation with an ultrasound as well as mammography in February 2013 additional fibroglandular tissue. Since then she notes that these areas have had some more discomfort associated with them and have begun to bother her a lot more in terms of their tenderness. She does drink some caffeine. She really has no other real risk factors for breast pain right now. Past Medical History  Diagnosis Date  . GOITER, MULTINODULAR 06/11/2010  . THYROIDITIS 06/11/2010  . ALLERGIC RHINITIS 06/11/2010    Past Surgical History  Procedure Date  . Tubal ligation 2006  . Endometrial ablation 2006    for menorrhagia    Family History  Problem Relation Age of Onset  . Thyroid disease Brother     had overactive thyroid  . Goiter Other     [employer]  . Alcohol abuse Other   . Diabetes Other     1st degree Relative  . Hypertension Other   . Stroke Other     Parent  . Cancer Other     Breast Cancer-Grandparent  . Cancer Other     Colon Cancer-Grandparent  . Hypertension Mother   . Diabetes Father   . Stroke Father   . Hypertension Father     Social History History  Substance Use Topics  . Smoking status: Never Smoker   . Smokeless tobacco: Not on file  . Alcohol Use: No    No Known Allergies  Current Outpatient  Prescriptions  Medication Sig Dispense Refill  . citalopram (CELEXA) 10 MG tablet Take 5 mg by mouth daily.      . fexofenadine (ALLEGRA) 180 MG tablet Take 180 mg by mouth daily.          Review of Systems Review of Systems  Constitutional: Negative for fever, chills and unexpected weight change.  HENT: Negative for hearing loss, congestion, sore throat, trouble swallowing and voice change.   Eyes: Negative for visual disturbance.  Respiratory: Negative for cough and wheezing.   Cardiovascular: Negative for chest pain, palpitations and leg swelling.  Gastrointestinal: Negative for nausea, vomiting, abdominal pain, diarrhea, constipation, blood in stool, abdominal distention and anal bleeding.  Genitourinary: Negative for hematuria, vaginal bleeding and difficulty urinating.  Musculoskeletal: Negative for arthralgias.  Skin: Negative for rash and wound.  Neurological: Negative for seizures, syncope and headaches.  Hematological: Negative for adenopathy. Does not bruise/bleed easily.  Psychiatric/Behavioral: Negative for confusion.    Blood pressure 96/62, pulse 85, temperature 98 F (36.7 C), temperature source Temporal, height 5' 3.75" (1.619 m), weight 118 lb 12.8 oz (53.887 kg), SpO2 99.00%.  Physical Exam Physical Exam  Vitals reviewed. Constitutional: She appears well-developed and well-nourished.  Pulmonary/Chest: Left breast exhibits tenderness. Left breast exhibits  no mass and no nipple discharge.    Lymphadenopathy:    She has no cervical adenopathy.    She has no axillary adenopathy.       Left: No supraclavicular adenopathy present.    Data Reviewed *RADIOLOGY REPORT*  Clinical Data: The patient and her physician feels a lump in the 9  o'clock position of the left breast. She also feels a lump in the  6 o'clock position of the left breast.  DIGITAL DIAGNOSTIC BILATERAL MAMMOGRAM WITH CAD AND LEFT BREAST  ULTRASOUND:  Comparison: None.  Findings: The breast  tissue is heterogeneously dense. There is no  dominant mass, architectural distortion or calcification to suggest  malignancy.  Mammographic images were processed with CAD.  On physical exam, no mass is palpated in the left breast.  Ultrasound is performed, showing mixed fibroglandular tissue and  fat in the areas of concern to the patient at 6 and 9 o'clock. No  mass, distortion or shadowing to suggest malignancy is identified.  IMPRESSION:  No mammographic or sonographic evidence of malignancy. Yearly  screening mammography should begin at age 29 unless clinically  indicated earlier.  BI-RADS CATEGORY 1: Negative.   Assessment    Fibrocystic changes left breast    Plan    I don't really identify a discrete mass on my examination today. She has an ultrasound from February that is fairly normal as well. She does however have some more symptoms associated with this. We discussed all of the different options including continued observation, repeat ultrasound, or surgery. I do not think these areas need to be excised. I told her additionally I don't know that it would help all the symptoms that she has. These areas are hard to identify as well. I think be reasonable to proceed with the middle options she would be more comfortable fat and repeat her ultrasound. If this is normal again then we can continue treating her for mastalgia. I will call her with the ultrasound is done.       Kristi Medina 05/26/2012, 9:49 AM

## 2012-06-01 ENCOUNTER — Other Ambulatory Visit (INDEPENDENT_AMBULATORY_CARE_PROVIDER_SITE_OTHER): Payer: Self-pay | Admitting: General Surgery

## 2012-06-01 ENCOUNTER — Ambulatory Visit
Admission: RE | Admit: 2012-06-01 | Discharge: 2012-06-01 | Disposition: A | Discharge status: Home / Self Care | Payer: BC Managed Care – PPO | Source: Ambulatory Visit | Attending: General Surgery | Admitting: General Surgery

## 2012-06-01 DIAGNOSIS — N6019 Diffuse cystic mastopathy of unspecified breast: Secondary | ICD-10-CM

## 2012-06-13 ENCOUNTER — Ambulatory Visit (INDEPENDENT_AMBULATORY_CARE_PROVIDER_SITE_OTHER): Payer: BC Managed Care – PPO | Admitting: Endocrinology

## 2012-06-13 ENCOUNTER — Encounter: Payer: Self-pay | Admitting: Endocrinology

## 2012-06-13 VITALS — BP 112/68 | HR 75 | Temp 97.5°F | Wt 122.0 lb

## 2012-06-13 DIAGNOSIS — E042 Nontoxic multinodular goiter: Secondary | ICD-10-CM

## 2012-06-13 NOTE — Progress Notes (Signed)
  Subjective:    Patient ID: Kristi Medina, female    DOB: Jul 19, 1975, 37 y.o.   MRN: [id number]  HPI Pt returns for f/u of thyroiditis and multinodular goiter (dx'ed 2007).  she was rx'ed synthroid, but this caused fatigue and tremor.  she stopped it.  she had bxs of bilat nodules in 2010.  pt states she feels well in general, except for slight constipation.  She does not notice the nodules.  pt states she feels well in general, except for depression.  This is improved with celexa.   Past Medical History  Diagnosis Date  . GOITER, MULTINODULAR 06/11/2010  . THYROIDITIS 06/11/2010  . ALLERGIC RHINITIS 06/11/2010    Past Surgical History  Procedure Date  . Tubal ligation 2006  . Endometrial ablation 2006    for menorrhagia    History   Social History  . Marital Status: Married    Spouse Name: N/A    Number of Children: N/A  . Years of Education: N/A   Occupational History  . Nurse [employer]   Social History Main Topics  . Smoking status: Never Smoker   . Smokeless tobacco: Not on file  . Alcohol Use: No  . Drug Use: No  . Sexually Active:    Other Topics Concern  . Not on file   Social History Narrative   Regular exercise-no    Current Outpatient Prescriptions on File Prior to Visit  Medication Sig Dispense Refill  . citalopram (CELEXA) 10 MG tablet Take 5 mg by mouth daily.      . fexofenadine (ALLEGRA) 180 MG tablet Take 180 mg by mouth daily.          No Known Allergies  Family History  Problem Relation Age of Onset  . Thyroid disease Brother     had overactive thyroid  . Goiter Other     [employer]  . Alcohol abuse Other   . Diabetes Other     1st degree Relative  . Hypertension Other   . Stroke Other     Parent  . Cancer Other     Breast Cancer-Grandparent  . Cancer Other     Colon Cancer-Grandparent  . Hypertension Mother   . Diabetes Father   . Stroke Father   . Hypertension Father     BP 112/68  Pulse 75  Temp 97.5 F (36.4 C) (Oral)  Wt  122 lb (55.339 kg)  SpO2 99%  Review of Systems Denies weight change.    Objective:   Physical Exam VITAL SIGNS:  See vs page. GENERAL: no distress. Thyroid:  There are 2 palpable nodules.  One at the mid-right, and 1 at the upper left.   outside test results are reviewed: TSH=3.5    Assessment & Plan:  H/o thyroid abnormalities as noted above, now euthyroid.

## 2012-06-13 NOTE — Patient Instructions (Addendum)
No treatment is needed for the thyroid now. Let's recheck the ultrasound .  you will receive a phone call, about a day and time for an appointment. Please return in 1 year.

## 2012-06-16 ENCOUNTER — Ambulatory Visit
Admission: RE | Admit: 2012-06-16 | Discharge: 2012-06-16 | Disposition: A | Discharge status: Home / Self Care | Payer: BC Managed Care – PPO | Source: Ambulatory Visit | Attending: Endocrinology | Admitting: Endocrinology

## 2012-06-16 DIAGNOSIS — E042 Nontoxic multinodular goiter: Secondary | ICD-10-CM

## 2012-06-23 ENCOUNTER — Telehealth (INDEPENDENT_AMBULATORY_CARE_PROVIDER_SITE_OTHER): Payer: Self-pay

## 2012-06-23 NOTE — Telephone Encounter (Signed)
Called pt to let her know that her mgm was normal per Dr Dwain Sarna. The pt does not need f/u appt with Dr Dwain Sarna unless if changes occur.

## 2012-06-26 ENCOUNTER — Other Ambulatory Visit (HOSPITAL_COMMUNITY)
Admission: RE | Admit: 2012-06-26 | Discharge: 2012-06-26 | Disposition: A | Discharge status: Home / Self Care | Payer: BC Managed Care – PPO | Source: Ambulatory Visit | Attending: Obstetrics and Gynecology | Admitting: Obstetrics and Gynecology

## 2012-06-26 ENCOUNTER — Other Ambulatory Visit: Payer: Self-pay | Admitting: Adult Health

## 2012-06-26 DIAGNOSIS — Z01419 Encounter for gynecological examination (general) (routine) without abnormal findings: Secondary | ICD-10-CM | POA: Insufficient documentation

## 2012-06-26 DIAGNOSIS — Z1151 Encounter for screening for human papillomavirus (HPV): Secondary | ICD-10-CM | POA: Insufficient documentation

## 2012-12-04 ENCOUNTER — Encounter: Payer: Self-pay | Admitting: *Deleted

## 2012-12-05 ENCOUNTER — Encounter: Payer: Self-pay | Admitting: Adult Health

## 2012-12-05 ENCOUNTER — Ambulatory Visit (INDEPENDENT_AMBULATORY_CARE_PROVIDER_SITE_OTHER): Payer: BC Managed Care – PPO | Admitting: Adult Health

## 2012-12-05 VITALS — BP 112/60 | Ht 63.0 in | Wt 127.0 lb

## 2012-12-05 DIAGNOSIS — A499 Bacterial infection, unspecified: Secondary | ICD-10-CM

## 2012-12-05 DIAGNOSIS — R351 Nocturia: Secondary | ICD-10-CM

## 2012-12-05 DIAGNOSIS — N760 Acute vaginitis: Secondary | ICD-10-CM

## 2012-12-05 DIAGNOSIS — N898 Other specified noninflammatory disorders of vagina: Secondary | ICD-10-CM

## 2012-12-05 DIAGNOSIS — R319 Hematuria, unspecified: Secondary | ICD-10-CM

## 2012-12-05 DIAGNOSIS — B9689 Other specified bacterial agents as the cause of diseases classified elsewhere: Secondary | ICD-10-CM

## 2012-12-05 HISTORY — DX: Other specified bacterial agents as the cause of diseases classified elsewhere: B96.89

## 2012-12-05 LAB — URINALYSIS
Bilirubin Urine: NEGATIVE
Glucose, UA: NEGATIVE mg/dL
Protein, ur: NEGATIVE mg/dL
pH: 6 (ref 5.0–8.0)

## 2012-12-05 LAB — POCT URINALYSIS DIPSTICK: Blood, UA: POSITIVE

## 2012-12-05 LAB — POCT WET PREP (WET MOUNT)

## 2012-12-05 MED ORDER — METRONIDAZOLE 500 MG PO TABS: 500.0000 mg | ORAL_TABLET | Freq: Two times a day (BID) | ORAL | Status: DC

## 2012-12-05 NOTE — Patient Instructions (Addendum)
Bacterial Vaginosis Bacterial vaginosis (BV) is a vaginal infection where the normal balance of bacteria in the vagina is disrupted. The normal balance is then replaced by an overgrowth of certain bacteria. There are several different kinds of bacteria that can cause BV. BV is the most common vaginal infection in women of childbearing age. CAUSES   The cause of BV is not fully understood. BV develops when there is an increase or imbalance of harmful bacteria.  Some activities or behaviors can upset the normal balance of bacteria in the vagina and put women at increased risk including:  Having a new sex partner or multiple sex partners.  Douching.  Using an intrauterine device (IUD) for contraception.  It is not clear what role sexual activity plays in the development of BV. However, women that have never had sexual intercourse are rarely infected with BV. Women do not get BV from toilet seats, bedding, swimming pools or from touching objects around them.  SYMPTOMS   Grey vaginal discharge.  A fish-like odor with discharge, especially after sexual intercourse.  Itching or burning of the vagina and vulva.  Burning or pain with urination.  Some women have no signs or symptoms at all. DIAGNOSIS  Your caregiver must examine the vagina for signs of BV. Your caregiver will perform lab tests and look at the sample of vaginal fluid through a microscope. They will look for bacteria and abnormal cells (clue cells), a pH test higher than 4.5, and a positive amine test all associated with BV.  RISKS AND COMPLICATIONS   Pelvic inflammatory disease (PID).  Infections following gynecology surgery.  Developing HIV.  Developing herpes virus. TREATMENT  Sometimes BV will clear up without treatment. However, all women with symptoms of BV should be treated to avoid complications, especially if gynecology surgery is planned. Female partners generally do not need to be treated. However, BV may spread  between female sex partners so treatment is helpful in preventing a recurrence of BV.   BV may be treated with antibiotics. The antibiotics come in either pill or vaginal cream forms. Either can be used with nonpregnant or pregnant women, but the recommended dosages differ. These antibiotics are not harmful to the baby.  BV can recur after treatment. If this happens, a second round of antibiotics will often be prescribed.  Treatment is important for pregnant women. If not treated, BV can cause a premature delivery, especially for a pregnant woman who had a premature birth in the past. All pregnant women who have symptoms of BV should be checked and treated.  For chronic reoccurrence of BV, treatment with a type of prescribed gel vaginally twice a week is helpful. HOME CARE INSTRUCTIONS   Finish all medication as directed by your caregiver.  Do not have sex until treatment is completed.  Tell your sexual partner that you have a vaginal infection. They should see their caregiver and be treated if they have problems, such as a mild rash or itching.  Practice safe sex. Use condoms. Only have 1 sex partner. PREVENTION  Basic prevention steps can help reduce the risk of upsetting the natural balance of bacteria in the vagina and developing BV:  Do not have sexual intercourse (be abstinent).  Do not douche.  Use all of the medicine prescribed for treatment of BV, even if the signs and symptoms go away.  Tell your sex partner if you have BV. That way, they can be treated, if needed, to prevent reoccurrence. SEEK MEDICAL CARE IF:     Your symptoms are not improving after 3 days of treatment.  You have increased discharge, pain, or fever. MAKE SURE YOU:   Understand these instructions.  Will watch your condition.  Will get help right away if you are not doing well or get worse. FOR MORE INFORMATION  Division of STD Prevention (DSTDP), Centers for Disease Control and Prevention:  [employer] American Social Health Association (ASHA): www.ashastd.org  Document Released: 05/24/2005 Document Revised: 08/16/2011 Document Reviewed: 11/14/2008 [facility] Patient Information 2014 [place], [place]. Take flagyl No sex no alcohol Call prn problems

## 2012-12-05 NOTE — Progress Notes (Signed)
Subjective:     Patient ID: Kristi Medina, female   DOB: June 09, 1975, 37 y.o.   MRN: [id number]  HPI Kristi Medina is a 37 year old white female, divorced in complaining of a vaginal discharge and itch and some nocturia.She has tried Diflucan and feels a little better.  Review of Systems Positives in HPI Reviewed past medical,surgical, social and family history. Reviewed medications and allergies.     Objective:   Physical Exam BP 112/60  Ht 5\' 3"  (1.6 m)  Wt 127 lb (57.607 kg)  BMI 22.5 kg/m2  LMP 11/09/2012   Urine dipstick ws 1+ blood, Skin warm and dry.Pelvic: external genitalia is normal in appearance, vagina: white stringy discharge without odor, cervix:smooth and bulbous, uterus: normal size, shape and contour, non tender, no masses felt, adnexa: no masses or tenderness noted. Wet prep: + for clue cells and +WBCs. GC/CHL obtained.  Assessment:      Vaginal discharge BV Nocturia Hematuria    Plan:      Rx flagyl 500 mg 1 bid x 7 days, no alcohol or sex Review handout on BV UA C&S sent, as well as GC/CHL

## 2012-12-06 ENCOUNTER — Telehealth: Payer: Self-pay | Admitting: Adult Health

## 2012-12-06 LAB — GC/CHLAMYDIA PROBE AMP
CT Probe RNA: NEGATIVE
GC Probe RNA: NEGATIVE

## 2012-12-06 NOTE — Telephone Encounter (Signed)
Left message that test negative  °

## 2013-04-12 ENCOUNTER — Other Ambulatory Visit: Payer: Self-pay

## 2013-06-04 ENCOUNTER — Encounter: Payer: Self-pay | Admitting: Adult Health

## 2013-06-04 ENCOUNTER — Ambulatory Visit (INDEPENDENT_AMBULATORY_CARE_PROVIDER_SITE_OTHER): Payer: BC Managed Care – PPO | Admitting: Adult Health

## 2013-06-04 VITALS — BP 110/60 | Ht 63.5 in | Wt 127.0 lb

## 2013-06-04 DIAGNOSIS — B379 Candidiasis, unspecified: Secondary | ICD-10-CM

## 2013-06-04 DIAGNOSIS — N898 Other specified noninflammatory disorders of vagina: Secondary | ICD-10-CM | POA: Insufficient documentation

## 2013-06-04 DIAGNOSIS — R110 Nausea: Secondary | ICD-10-CM

## 2013-06-04 DIAGNOSIS — L293 Anogenital pruritus, unspecified: Secondary | ICD-10-CM

## 2013-06-04 LAB — POCT WET PREP (WET MOUNT)

## 2013-06-04 MED ORDER — NORETHIN-ETH ESTRAD-FE BIPHAS 1 MG-10 MCG / 10 MCG PO TABS: 1.0000 | ORAL_TABLET | Freq: Every day | ORAL | Status: DC

## 2013-06-04 MED ORDER — FLUCONAZOLE 150 MG PO TABS: ORAL_TABLET | ORAL | Status: DC

## 2013-06-04 NOTE — Patient Instructions (Addendum)
Monilial Vaginitis Vaginitis in a soreness, swelling and redness (inflammation) of the vagina and vulva. Monilial vaginitis is not a sexually transmitted infection. CAUSES  Yeast vaginitis is caused by yeast (candida) that is normally found in your vagina. With a yeast infection, the candida has overgrown in number to a point that upsets the chemical balance. SYMPTOMS   White, thick vaginal discharge.  Swelling, itching, redness and irritation of the vagina and possibly the lips of the vagina (vulva).  Burning or painful urination.  Painful intercourse. DIAGNOSIS  Things that may contribute to monilial vaginitis are:  Postmenopausal and virginal states.  Pregnancy.  Infections.  Being tired, sick or stressed, especially if you had monilial vaginitis in the past.  Diabetes. Good control will help lower the chance.  Birth control pills.  Tight fitting garments.  Using bubble bath, feminine sprays, douches or deodorant tampons.  Taking certain medications that kill germs (antibiotics).  Sporadic recurrence can occur if you become ill. TREATMENT  Your caregiver will give you medication.  There are several kinds of anti monilial vaginal creams and suppositories specific for monilial vaginitis. For recurrent yeast infections, use a suppository or cream in the vagina 2 times a week, or as directed.  Anti-monilial or steroid cream for the itching or irritation of the vulva may also be used. Get your caregiver's permission.  Painting the vagina with methylene blue solution may help if the monilial cream does not work.  Eating yogurt may help prevent monilial vaginitis. HOME CARE INSTRUCTIONS   Finish all medication as prescribed.  Do not have sex until treatment is completed or after your caregiver tells you it is okay.  Take warm sitz baths.  Do not douche.  Do not use tampons, especially scented ones.  Wear cotton underwear.  Avoid tight pants and panty  hose.  Tell your sexual partner that you have a yeast infection. They should go to their caregiver if they have symptoms such as mild rash or itching.  Your sexual partner should be treated as well if your infection is difficult to eliminate.  Practice safer sex. Use condoms.  Some vaginal medications cause latex condoms to fail. Vaginal medications that harm condoms are:  Cleocin cream.  Butoconazole (Femstat).  Terconazole (Terazol) vaginal suppository.  Miconazole (Monistat) (may be purchased over the counter). SEEK MEDICAL CARE IF:   You have a temperature by mouth above 102 F (38.9 C).  The infection is getting worse after 2 days of treatment.  The infection is not getting better after 3 days of treatment.  You develop blisters in or around your vagina.  You develop vaginal bleeding, and it is not your menstrual period.  You have pain when you urinate.  You develop intestinal problems.  You have pain with sexual intercourse. Document Released: 03/03/2005 Document Revised: 08/16/2011 Document Reviewed: 11/15/2008 [facility] Patient Information 2014 [place], [place]. Follow up prn Start  Lo loestrin with next period

## 2013-06-04 NOTE — Progress Notes (Signed)
Subjective:     Patient ID: Kristi Medina, female   DOB: Apr 08, 1976, 37 y.o.   MRN: [id number]  HPI Kristi Medina is a 37 year old white female divorced in complaining of vaginal itch, nausea at times like when had H pylori.Feels like skin tears at times near rectum.  Review of Systems See HPI Reviewed past medical,surgical, social and family history. Reviewed medications and allergies.     Objective:   Physical Exam BP 110/60  Ht 5' 3.5" (1.613 m)  Wt 127 lb (57.607 kg)  BMI 22.14 kg/m2  LMP 05/06/2013   Skin warm and dry.Pelvic: external genitalia is normal in appearance, vagina: white discharge without odor, cervix:smooth and bulbous, uterus: normal size, shape and contour, non tender, no masses felt, adnexa: no masses or tenderness noted. Wet prep: + yeast  Assessment:    vaginal itch Yeast Nausea     Plan:     Rx diflucan 150 mg # 5 1 daily x 5 days with 1 refill Check FSH and H pylori Try lo loestrin 2 packs given to start with next period  Use anusol hc or calmoseptic to rectal area Follow up prn will talk when labs back

## 2013-06-05 ENCOUNTER — Telehealth: Payer: Self-pay | Admitting: Adult Health

## 2013-06-05 LAB — FOLLICLE STIMULATING HORMONE: FSH: 2.8 m[IU]/mL

## 2013-06-05 LAB — H. PYLORI ANTIBODY, IGG: H Pylori IgG: 0.4 {ISR}

## 2013-06-05 NOTE — Telephone Encounter (Signed)
Pt aware of labs  

## 2013-06-13 ENCOUNTER — Ambulatory Visit (INDEPENDENT_AMBULATORY_CARE_PROVIDER_SITE_OTHER): Payer: BC Managed Care – PPO | Admitting: Endocrinology

## 2013-06-13 ENCOUNTER — Encounter: Payer: Self-pay | Admitting: Endocrinology

## 2013-06-13 VITALS — BP 122/74 | HR 77 | Temp 98.6°F | Ht 63.5 in | Wt 126.4 lb

## 2013-06-13 DIAGNOSIS — E042 Nontoxic multinodular goiter: Secondary | ICD-10-CM

## 2013-06-13 LAB — TSH: TSH: 0.56 u[IU]/mL (ref 0.35–5.50)

## 2013-06-13 NOTE — Patient Instructions (Addendum)
blood tests are being requested for you today.  We'll contact you with results. Let's recheck the ultrasound .  you will receive a phone call, about a day and time for an appointment. Please return in 1 year.

## 2013-06-13 NOTE — Progress Notes (Signed)
   Subjective:    Patient ID: Kristi Medina, female    DOB: Jul 21, 1975, 38 y.o.   MRN: [id number]  HPI Pt returns for f/u of multinodular goiter (dx'ed 2007; antibodies were pos).  she was rx'ed synthroid, but this caused fatigue and tremor, so she stopped it.  she had bxs of bilat nodules in 2010.    She does not notice the nodules. She reports muscle weakness.   Past Medical History  Diagnosis Date  . GOITER, MULTINODULAR 06/11/2010  . THYROIDITIS 06/11/2010  . ALLERGIC RHINITIS 06/11/2010  . BV (bacterial vaginosis) 12/05/2012    Past Surgical History  Procedure Laterality Date  . Tubal ligation  2006  . Endometrial ablation  2006    for menorrhagia    History   Social History  . Marital Status: Married    Spouse Name: N/A    Number of Children: N/A  . Years of Education: N/A   Occupational History  . Nurse [employer]   Social History Main Topics  . Smoking status: Never Smoker   . Smokeless tobacco: Never Used  . Alcohol Use: No  . Drug Use: No  . Sexual Activity: Yes    Birth Control/ Protection: Surgical   Other Topics Concern  . Not on file   Social History Narrative   Regular exercise-no    Current Outpatient Prescriptions on File Prior to Visit  Medication Sig Dispense Refill  . fluconazole (DIFLUCAN) 150 MG tablet Take 1 daily x 5 days  5 tablet  1  . Norethindrone-Ethinyl Estradiol-Fe Biphas (LO LOESTRIN FE) 1 MG-10 MCG / 10 MCG tablet Take 1 tablet by mouth daily.  2 Package  0   No current facility-administered medications on file prior to visit.   No Known Allergies  Family History  Problem Relation Age of Onset  . Thyroid disease Brother     had overactive thyroid  . Goiter Other     [employer]  . Alcohol abuse Other   . Diabetes Other     1st degree Relative  . Hypertension Other   . Stroke Other     Parent  . Cancer Other     Breast Cancer-Grandparent  . Cancer Other     Colon Cancer-Grandparent  . Hypertension Mother   . Diabetes Father     . Stroke Father   . Hypertension Father    BP 122/74  Pulse 77  Temp(Src) 98.6 F (37 C) (Oral)  Ht 5' 3.5" (1.613 m)  Wt 126 lb 7 oz (57.352 kg)  BMI 22.04 kg/m2  SpO2 97%  LMP 05/06/2013  Review of Systems She has fatigue    Objective:   Physical Exam VITAL SIGNS:  See vs page GENERAL: no distress Neck: i can feel a small left thyroid nodule.     Lab Results  Component Value Date   TSH 0.56 06/13/2013      Assessment & Plan:  Multinodular goiter, clinically stable.

## 2013-06-25 ENCOUNTER — Ambulatory Visit
Admission: RE | Admit: 2013-06-25 | Discharge: 2013-06-25 | Disposition: A | Discharge status: Home / Self Care | Payer: BC Managed Care – PPO | Source: Ambulatory Visit | Attending: Endocrinology | Admitting: Endocrinology

## 2013-06-25 DIAGNOSIS — E042 Nontoxic multinodular goiter: Secondary | ICD-10-CM

## 2013-07-30 ENCOUNTER — Ambulatory Visit (INDEPENDENT_AMBULATORY_CARE_PROVIDER_SITE_OTHER): Payer: BC Managed Care – PPO | Admitting: Adult Health

## 2013-07-30 ENCOUNTER — Encounter: Payer: Self-pay | Admitting: Adult Health

## 2013-07-30 VITALS — BP 100/64 | HR 74 | Ht 63.5 in | Wt 130.0 lb

## 2013-07-30 DIAGNOSIS — Z01419 Encounter for gynecological examination (general) (routine) without abnormal findings: Secondary | ICD-10-CM

## 2013-07-30 MED ORDER — NORETHIN-ETH ESTRAD-FE BIPHAS 1 MG-10 MCG / 10 MCG PO TABS: 1.0000 | ORAL_TABLET | Freq: Every day | ORAL | Status: DC

## 2013-07-30 NOTE — Progress Notes (Signed)
Patient ID: Kristi Medina, female   DOB: [birth year], 38 y.o.   MRN: [id number] History of Present Illness: Kristi Medina is a 38 year old white female, separated in for a physical, she had a normal pap with negative HPV 06/26/12.   Current Medications, Allergies, Past Medical History, Past Surgical History, Family History and Social History were reviewed in [employer] record.     Review of Systems: Patient denies any headaches, blurred vision, shortness of breath, chest pain, abdominal pain, problems with bowel movements, urination, or intercourse. No joint pain or mood changes, is better with OCs.Has joined the gym.    Physical Exam:BP 100/64  Pulse 74  Ht 5' 3.5" (1.613 m)  Wt 130 lb (58.968 kg)  BMI 22.66 kg/m2  LMP 06/08/2013 General:  Well developed, well nourished, no acute distress Skin:  Warm and dry Neck:  Midline trachea,enlarged thyroid, history of goiter, sees Dr Loanne Drilling Lungs; Clear to auscultation bilaterally Breast:  No dominant palpable mass, retraction, or nipple discharge Cardiovascular: Regular rate and rhythm Abdomen:  Soft, non tender, no hepatosplenomegaly Pelvic:  External genitalia is normal in appearance.  The vagina is normal in appearance. The cervix is bulbous.  Uterus is felt to be normal size, shape, and contour.  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

## 2013-07-30 NOTE — Patient Instructions (Signed)
Physical in 1 year Mammogram at 40 Follow up prn

## 2013-11-07 ENCOUNTER — Ambulatory Visit (INDEPENDENT_AMBULATORY_CARE_PROVIDER_SITE_OTHER): Payer: BC Managed Care – PPO | Admitting: Family Medicine

## 2013-11-07 ENCOUNTER — Encounter: Payer: Self-pay | Admitting: Family Medicine

## 2013-11-07 ENCOUNTER — Ambulatory Visit (HOSPITAL_COMMUNITY)
Admission: RE | Admit: 2013-11-07 | Discharge: 2013-11-07 | Disposition: A | Discharge status: Home / Self Care | Payer: BC Managed Care – PPO | Source: Ambulatory Visit | Attending: Family Medicine | Admitting: Family Medicine

## 2013-11-07 VITALS — BP 112/86 | HR 73 | Temp 98.8°F | Ht 63.5 in | Wt 129.0 lb

## 2013-11-07 DIAGNOSIS — R0609 Other forms of dyspnea: Secondary | ICD-10-CM | POA: Diagnosis not present

## 2013-11-07 DIAGNOSIS — R0989 Other specified symptoms and signs involving the circulatory and respiratory systems: Secondary | ICD-10-CM

## 2013-11-07 DIAGNOSIS — R0600 Dyspnea, unspecified: Secondary | ICD-10-CM

## 2013-11-07 DIAGNOSIS — R079 Chest pain, unspecified: Secondary | ICD-10-CM | POA: Diagnosis not present

## 2013-11-07 LAB — D-DIMER, QUANTITATIVE (NOT AT ARMC): D DIMER QUANT: 0.45 ug{FEU}/mL (ref 0.00–0.48)

## 2013-11-07 NOTE — Progress Notes (Signed)
   Subjective:    Patient ID: Kristi Medina, female    DOB: 1975-12-07, 38 y.o.   MRN: [id number]  Shortness of Breath This is a new problem. Episode onset: 3 weeks ago. The problem occurs daily. Associated symptoms include chest pain and headaches. The symptoms are aggravated by exercise. Associated symptoms comments: One episode of sharp chest pain before the SOB started. She took a Zantac, and has not had a problem since.   Pt had dizziness on Monday x 1. Woke her from her sleep.. The patient has no known risk factors for DVT/PE. Treatments tried: Phenergan x 1 on Monday for the dizziness. Tylenol and Sudafed for fluid in her left ear.    Started with chest pain, left side of the chest, sharp and hurt. Painful. Lasted twenty min. Occurred suddenly  Finds herself sob since then without much exertion and often with no exertion  Felt dizzy and spiinning and nause, almost threw up. Worse with change of head position, took a phenergan helped some.  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  Nonsmoker  Reg exercise with , Still has twinge of discomfort and No excess ,   Review of Systems  Respiratory: Positive for shortness of breath.   Cardiovascular: Positive for chest pain.  Neurological: Positive for headaches.       Objective:   Physical Exam Alert no acute distress. Lungs clear heart regular in rhythm. No chest wall tenderness. Abdominal exam benign.  EKG normal sinus rhythm no significant ST-T changes. Next  O2 saturation 90%.       Assessment & Plan:  Impression dyspnea with sharp chest pain an ongoing shortness of breath and risk factors with birth control pills. Further workup warranted. Plan O2 sat and EKG stable. D-dimer completely normal chest x-ray normal. High likelihood not a clot discussed with patient. Recommend return to exercise. Warning signs discussed. Recheck if worsens or persists. WSL

## 2013-11-19 ENCOUNTER — Encounter: Payer: Self-pay | Admitting: Family Medicine

## 2014-01-01 ENCOUNTER — Other Ambulatory Visit: Payer: Self-pay | Admitting: Adult Health

## 2014-01-01 MED ORDER — FLUCONAZOLE 150 MG PO TABS: ORAL_TABLET | ORAL | Status: DC

## 2014-01-10 ENCOUNTER — Other Ambulatory Visit: Payer: Self-pay | Admitting: Adult Health

## 2014-01-10 MED ORDER — NORETHIN-ETH ESTRAD-FE BIPHAS 1 MG-10 MCG / 10 MCG PO TABS: 1.0000 | ORAL_TABLET | Freq: Every day | ORAL | Status: DC

## 2014-04-08 ENCOUNTER — Encounter: Payer: Self-pay | Admitting: Family Medicine

## 2014-06-18 ENCOUNTER — Encounter: Payer: Self-pay | Admitting: Endocrinology

## 2014-06-18 ENCOUNTER — Ambulatory Visit (INDEPENDENT_AMBULATORY_CARE_PROVIDER_SITE_OTHER): Payer: BC Managed Care – PPO | Admitting: Endocrinology

## 2014-06-18 VITALS — BP 124/84 | HR 78 | Temp 98.3°F | Wt 133.0 lb

## 2014-06-18 DIAGNOSIS — E042 Nontoxic multinodular goiter: Secondary | ICD-10-CM

## 2014-06-18 LAB — TSH: TSH: 1.78 u[IU]/mL (ref 0.35–4.50)

## 2014-06-18 NOTE — Progress Notes (Signed)
   Subjective:    Patient ID: Kristi Medina, female    DOB: 04-11-1976, 39 y.o.   MRN: [id number]  HPI Pt returns for f/u of multinodular goiter (dx'ed 2007; antibodies were pos).  she was rx'ed synthroid, but this caused fatigue and tremor, so she stopped it.  she had bxs of bilat nodules in 2010 (pt says benign, but results are not available).    Pt states few mos of intermittent slight palpitations in the chest, and assoc sob.  eval by Dr Wolfgang Phoenix was normal.   Past Medical History  Diagnosis Date  . GOITER, MULTINODULAR 06/11/2010  . THYROIDITIS 06/11/2010  . ALLERGIC RHINITIS 06/11/2010  . BV (bacterial vaginosis) 12/05/2012    Past Surgical History  Procedure Laterality Date  . Tubal ligation  2006  . Endometrial ablation  2006    for menorrhagia    History   Social History  . Marital Status: Married    Spouse Name: N/A    Number of Children: N/A  . Years of Education: N/A   Occupational History  . Nurse [employer]   Social History Main Topics  . Smoking status: Never Smoker   . Smokeless tobacco: Never Used  . Alcohol Use: No  . Drug Use: No  . Sexual Activity: Yes    Birth Control/ Protection: Surgical, Pill   Other Topics Concern  . Not on file   Social History Narrative   Regular exercise-no    Current Outpatient Prescriptions on File Prior to Visit  Medication Sig Dispense Refill  . Norethindrone-Ethinyl Estradiol-Fe Biphas (LO LOESTRIN FE) 1 MG-10 MCG / 10 MCG tablet Take 1 tablet by mouth daily. 6 Package 0   No current facility-administered medications on file prior to visit.    No Known Allergies  Family History  Problem Relation Age of Onset  . Thyroid disease Brother     had overactive thyroid  . Goiter Other     [employer]  . Alcohol abuse Other   . Diabetes Other     1st degree Relative  . Hypertension Other   . Stroke Other     Parent  . Cancer Other     Breast Cancer-Grandparent  . Cancer Other     Colon Cancer-Grandparent  . Hypertension  Mother   . Diabetes Father   . Stroke Father   . Hypertension Father     BP 124/84 mmHg  Pulse 78  Temp(Src) 98.3 F (36.8 C) (Oral)  Wt 133 lb (60.328 kg)  SpO2 98%   Review of Systems Denies weight change.      Objective:   Physical Exam VITAL SIGNS:  See vs page.  GENERAL: no distress.  Neck: i can again feel a small left thyroid nodule.    Lab Results  Component Value Date   TSH 1.78 06/18/2014      Assessment & Plan:  Multinodular goiter.  Euthyroid.  She can take a year off of [place]  Patient is advised the following: Patient Instructions  blood tests are being requested for you today.  We'll contact you with results. We can skip the ultrasound this year.   most of the time, a "lumpy thyroid" will eventually become overactive.  this is usually a slow process, happening over the span of many years.  Please return in 1 year.

## 2014-06-18 NOTE — Patient Instructions (Addendum)
blood tests are being requested for you today.  We'll contact you with results. We can skip the ultrasound this year.   most of the time, a "lumpy thyroid" will eventually become overactive.  this is usually a slow process, happening over the span of many years.  Please return in 1 year.

## 2014-08-15 ENCOUNTER — Encounter: Payer: Self-pay | Admitting: Family Medicine

## 2014-08-15 ENCOUNTER — Ambulatory Visit (INDEPENDENT_AMBULATORY_CARE_PROVIDER_SITE_OTHER): Payer: BC Managed Care – PPO | Admitting: Family Medicine

## 2014-08-15 VITALS — BP 122/80 | Ht 63.5 in | Wt 130.0 lb

## 2014-08-15 DIAGNOSIS — Z13220 Encounter for screening for lipoid disorders: Secondary | ICD-10-CM

## 2014-08-15 DIAGNOSIS — R002 Palpitations: Secondary | ICD-10-CM

## 2014-08-15 NOTE — Progress Notes (Signed)
   Subjective:    Patient ID: Kristi Medina, female    DOB: 1976/06/04, 39 y.o.   MRN: [id number]  Palpitations  Associated symptoms include shortness of breath. Associated symptoms comments: Dull pain in chest.    catches breath and hits.. No sig pro with major sob   Feels sence of pressure  Comes and goes  Noticed it a couple timew per minute  Then noted five or ten min later Stops,  Exercising two or three times per wk  The patient is very concerned about these episodes. Worried that it is a sign of serious heart difficulties.  History of hypothyroidism. TSH in January 1 0.5. Followed by endocrinologist No sig change  Day [phone]  Review of Systems  Respiratory: Positive for shortness of breath.   Cardiovascular: Positive for palpitations.       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 Chance of underlying heart disease very low. Therefore high likelihood is that these are aggravating though not serious. However with patient's ongoing crescendo experience with these despite her own changes, and thyroid not contributing, we'll press on with cardiology referral. Easily 25 minutes spent most in discussion plan cardiology referral. Maintain same attempt said minimal caffeine appropriate thyroid uses center. Also we'll do blood work [employer]

## 2014-08-17 LAB — BASIC METABOLIC PANEL
BUN/Creatinine Ratio: 13 (ref 8–20)
BUN: 13 mg/dL (ref 6–20)
CALCIUM: 9.3 mg/dL (ref 8.7–10.2)
CO2: 22 mmol/L (ref 18–29)
Chloride: 105 mmol/L (ref 97–108)
Creatinine, Ser: 1.04 mg/dL — ABNORMAL HIGH (ref 0.57–1.00)
GFR calc Af Amer: 79 mL/min/{1.73_m2} (ref >59)
GFR calc non Af Amer: 68 mL/min/{1.73_m2} (ref >59)
Glucose: 89 mg/dL (ref 65–99)
Potassium: 4.6 mmol/L (ref 3.5–5.2)
Sodium: 142 mmol/L (ref 134–144)

## 2014-08-17 LAB — LIPID PANEL
Chol/HDL Ratio: 3 ratio units (ref 0.0–4.4)
Cholesterol, Total: 151 mg/dL (ref 100–199)
HDL: 50 mg/dL (ref >39)
LDL Calculated: 79 mg/dL (ref 0–99)
Triglycerides: 108 mg/dL (ref 0–149)
VLDL Cholesterol Cal: 22 mg/dL (ref 5–40)

## 2014-08-17 LAB — MAGNESIUM: Magnesium: 1.9 mg/dL (ref 1.6–2.3)

## 2014-08-18 ENCOUNTER — Encounter: Payer: Self-pay | Admitting: Family Medicine

## 2014-08-28 ENCOUNTER — Encounter: Payer: Self-pay | Admitting: Family Medicine

## 2014-08-30 ENCOUNTER — Encounter: Payer: Self-pay | Admitting: Radiology

## 2014-08-30 ENCOUNTER — Ambulatory Visit (INDEPENDENT_AMBULATORY_CARE_PROVIDER_SITE_OTHER): Payer: BC Managed Care – PPO | Admitting: Cardiology

## 2014-08-30 ENCOUNTER — Encounter (INDEPENDENT_AMBULATORY_CARE_PROVIDER_SITE_OTHER): Payer: BC Managed Care – PPO

## 2014-08-30 ENCOUNTER — Encounter: Payer: Self-pay | Admitting: Cardiology

## 2014-08-30 VITALS — BP 118/70 | HR 71 | Ht 63.5 in | Wt 134.0 lb

## 2014-08-30 DIAGNOSIS — R002 Palpitations: Secondary | ICD-10-CM | POA: Diagnosis not present

## 2014-08-30 DIAGNOSIS — R0602 Shortness of breath: Secondary | ICD-10-CM | POA: Diagnosis not present

## 2014-08-30 NOTE — Progress Notes (Signed)
Patient ID: Kristi Medina, female   DOB: June 28, 1975, 39 y.o.   MRN: [id number]      Cardiology Office Note   Date:  08/30/2014   ID:  Kristi Medina, DOB 03-16-1976, MRN [id number]  PCP:  Kristi Battiest, MD  Cardiologist:  Kristi Spark, MD   Chief complaint: Palpitations and shortness of breath.    History of Present Illness: Kristi Medina is a 39 y.o. female who presents for valuation of palpitations. The patient has history of Hashimoto thyroiditis with 4 nodules in her left thyroid that are being followed by her primary care physician. The patient doesn't take any medication other than oral contraception pill. She goes relatively regularly to the gym. The patient states that since last year in June she has noticed palpitations that feels like a strong beats followed by a pulse. Becoming clusters and can happen several time in a minute and very frequently in few hours and then they won't happen for longer time. She has noticed that the increasing in frequency and they're also associated with shortness of breath and very uncomfortable to her. She denies any associated dizziness or recent syncope. When she exercises at the gym that actually improve and they get worse at rest. She has tried to cut down on caffeine with no improvement. She hasn't noticed any relation with any specific food or activity. She doesn't have family history of premature coronary artery disease. She has never smoked. She denies orthopnea, paroxysmal nocturnal dyspnea, chest pain or claudications.    Past Medical History  Diagnosis Date  . GOITER, MULTINODULAR 06/11/2010  . THYROIDITIS 06/11/2010  . ALLERGIC RHINITIS 06/11/2010  . BV (bacterial vaginosis) 12/05/2012    Past Surgical History  Procedure Laterality Date  . Tubal ligation  2006  . Endometrial ablation  2006    for menorrhagia    Current Outpatient Prescriptions  Medication Sig Dispense Refill  . Norethindrone-Ethinyl Estradiol-Fe Biphas (LO LOESTRIN  FE) 1 MG-10 MCG / 10 MCG tablet Take 1 tablet by mouth daily. 6 Package 0   No current facility-administered medications for this visit.    Allergies:   Review of patient's allergies indicates no known allergies.    Social History:  The patient  reports that she has never smoked. She has never used smokeless tobacco. She reports that she does not drink alcohol or use illicit drugs.   Family History:  The patient's family history includes Alcohol abuse in her other; Cancer in her other and other; Diabetes in her father and other; Goiter in her other; Hypertension in her father, mother, and other; Stroke in her father and other; Thyroid disease in her brother.    ROS:  Please see the history of present illness.   Otherwise, review of systems are positive for none.   All other systems are reviewed and negative.    PHYSICAL EXAM: VS:  BP 118/70 mmHg  Pulse 71  Ht 5' 3.5" (1.613 m)  Wt 134 lb (60.782 kg)  BMI 23.36 kg/m2 , BMI Body mass index is 23.36 kg/(m^2). GEN: Well nourished, well developed, in no acute distress HEENT: normal Neck: no JVD, carotid bruits, or masses Cardiac: RRR; no murmurs, rubs, or gallops,no edema  Respiratory:  clear to auscultation bilaterally, normal work of breathing GI: soft, nontender, nondistended, + BS MS: no deformity or atrophy Skin: warm and dry, no rash Neuro:  Strength and sensation are intact Psych: euthymic mood, full affect   EKG:  EKG is ordered today. The ekg ordered  today demonstrates sinus rhythm, normal EKG.   Recent Labs: 06/18/2014: TSH 1.78 08/16/2014: BUN 13; Creatinine 1.04*; Magnesium 1.9; Potassium 4.6; Sodium 142    Lipid Panel    Component Value Date/Time   CHOL 151 08/16/2014 0756   TRIG 108 08/16/2014 0756   HDL 50 08/16/2014 0756   CHOLHDL 3.0 08/16/2014 0756   LDLCALC 79 08/16/2014 0756      Wt Readings from Last 3 Encounters:  08/30/14 134 lb (60.782 kg)  08/15/14 130 lb (58.968 kg)  06/18/14 133 lb (60.328  kg)      Other studies Reviewed: Additional studies/ records that were reviewed today include: labs. Review of the above records demonstrates: All normal including TSH   ASSESSMENT AND PLAN:  39 year old female  1. The patient's associated with shortness of breath, based on description they appear like PVCs, we will start 48 hour Holter monitor to identify arrhythmia in frequency. For now no therapy all labs were reviewed and they're all normal She is advised to continue doing everything as usual so we can see the arrhythmias. We will follow her in one month.   Current medicines are reviewed at length with the patient today.  The patient does not have concerns regarding medicines.  The following changes have been made:  no change  Labs/ tests ordered today include: 48-hour Holter monitor  No orders of the defined types were placed in this encounter.    Signed, Kristi Spark, MD  08/30/2014 10:05 AM    [facility] [place], [place], [place]  [address] Phone: [phone]; Fax: [phone]

## 2014-08-30 NOTE — Progress Notes (Signed)
Patient ID: Kristi Medina, female   DOB: 03-08-1976, 39 y.o.   MRN: [id number] [employer] 48hr holter applied

## 2014-08-30 NOTE — Patient Instructions (Signed)
Your physician recommends that you continue on your current medications as directed. Please refer to the Current Medication list given to you today.    Your physician has recommended that you wear a 48 HOUR  holter monitor. Holter monitors are medical devices that record the heart's electrical activity. Doctors most often use these monitors to diagnose arrhythmias. Arrhythmias are problems with the speed or rhythm of the heartbeat. The monitor is a small, portable device. You can wear one while you do your normal daily activities. This is usually used to diagnose what is causing palpitations/syncope (passing out).   Your physician recommends that you schedule a follow-up appointment in: [place]

## 2014-09-09 ENCOUNTER — Telehealth: Payer: Self-pay | Admitting: *Deleted

## 2014-09-09 NOTE — Telephone Encounter (Signed)
Pt informed that per Dr Meda Coffee her 24 hour holter monitor results were normal.  Pt verbalized understanding.

## 2014-09-25 ENCOUNTER — Ambulatory Visit (INDEPENDENT_AMBULATORY_CARE_PROVIDER_SITE_OTHER): Payer: BC Managed Care – PPO | Admitting: Cardiology

## 2014-09-25 ENCOUNTER — Encounter: Payer: Self-pay | Admitting: Cardiology

## 2014-09-25 VITALS — BP 96/68 | HR 88 | Ht 63.5 in | Wt 135.2 lb

## 2014-09-25 DIAGNOSIS — R0602 Shortness of breath: Secondary | ICD-10-CM

## 2014-09-25 DIAGNOSIS — R002 Palpitations: Secondary | ICD-10-CM | POA: Diagnosis not present

## 2014-09-25 NOTE — Progress Notes (Signed)
Patient ID: Kristi Medina, female   DOB: 1975/10/03, 39 y.o.   MRN: [id number] Patient ID: Kristi Medina, female   DOB: 09/26/1975, 39 y.o.   MRN: [id number]      Cardiology Office Note   Date:  09/25/2014   ID:  Kristi Medina, DOB 1976/04/13, MRN [id number]  PCP:  Rubbie Battiest, MD  Cardiologist:  Dorothy Spark, MD   Chief complaint: Palpitations and shortness of breath.    History of Present Illness: Kristi Medina is a 39 y.o. female who presents for valuation of palpitations. The patient has history of Hashimoto thyroiditis with 4 nodules in her left thyroid that are being followed by her primary care physician. The patient doesn't take any medication other than oral contraception pill. She goes relatively regularly to the gym. The patient states that since last year in June she has noticed palpitations that feels like a strong beats followed by a pulse. Becoming clusters and can happen several time in a minute and very frequently in few hours and then they won't happen for longer time. She has noticed that the increasing in frequency and they're also associated with shortness of breath and very uncomfortable to her. She denies any associated dizziness or recent syncope. When she exercises at the gym that actually improve and they get worse at rest. She has tried to cut down on caffeine with no improvement. She hasn't noticed any relation with any specific food or activity. She doesn't have family history of premature coronary artery disease. She has never smoked. She denies orthopnea, paroxysmal nocturnal dyspnea, chest pain or claudications.  09/25/2014 - follow up appointment for palpitations, the symptoms have improved, she has has palpitations during Holter monitoring, denies presyncope or syncope, no DOE or CP.   Past Medical History  Diagnosis Date  . GOITER, MULTINODULAR 06/11/2010  . THYROIDITIS 06/11/2010  . ALLERGIC RHINITIS 06/11/2010  . BV (bacterial vaginosis) 12/05/2012     Past Surgical History  Procedure Laterality Date  . Tubal ligation  2006  . Endometrial ablation  2006    for menorrhagia    Current Outpatient Prescriptions  Medication Sig Dispense Refill  . Norethindrone-Ethinyl Estradiol-Fe Biphas (LO LOESTRIN FE) 1 MG-10 MCG / 10 MCG tablet Take 1 tablet by mouth daily. 6 Package 0   No current facility-administered medications for this visit.    Allergies:   Review of patient's allergies indicates no known allergies.    Social History:  The patient  reports that she has never smoked. She has never used smokeless tobacco. She reports that she does not drink alcohol or use illicit drugs.   Family History:  The patient's family history includes Alcohol abuse in her other; Cancer in her other and other; Diabetes in her father and other; Goiter in her other; Hypertension in her father, mother, and other; Stroke in her father and other; Thyroid disease in her brother.    ROS:  Please see the history of present illness.   Otherwise, review of systems are positive for none.   All other systems are reviewed and negative.    PHYSICAL EXAM: VS:  BP 96/68 mmHg  Pulse 88  Ht 5' 3.5" (1.613 m)  Wt 135 lb 3.2 oz (61.326 kg)  BMI 23.57 kg/m2 , BMI Body mass index is 23.57 kg/(m^2). GEN: Well nourished, well developed, in no acute distress HEENT: normal Neck: no JVD, carotid bruits, or masses Cardiac: RRR; no murmurs, rubs, or gallops,no edema  Respiratory:  clear to auscultation bilaterally, normal  work of breathing GI: soft, nontender, nondistended, + BS MS: no deformity or atrophy Skin: warm and dry, no rash Neuro:  Strength and sensation are intact Psych: euthymic mood, full affect   EKG:  EKG is ordered today. The ekg ordered today demonstrates sinus rhythm, normal EKG.   Recent Labs: 06/18/2014: TSH 1.78 08/16/2014: BUN 13; Creatinine 1.04*; Magnesium 1.9; Potassium 4.6; Sodium 142    Lipid Panel    Component Value Date/Time    CHOL 151 08/16/2014 0756   TRIG 108 08/16/2014 0756   HDL 50 08/16/2014 0756   CHOLHDL 3.0 08/16/2014 0756   LDLCALC 79 08/16/2014 0756      Wt Readings from Last 3 Encounters:  09/25/14 135 lb 3.2 oz (61.326 kg)  08/30/14 134 lb (60.782 kg)  08/15/14 130 lb (58.968 kg)      Other studies Reviewed: Additional studies/ records that were reviewed today include: labs. Review of the above records demonstrates: All normal including TSH    ASSESSMENT AND PLAN:  39 year old female  1. Palpitations - 24 hour Holter monitor was normal and showed only infrequent PACs or PVCs, no arrhythmias during episodes of palpitations.  Labs all normal. The patient is reassured that this condition is not life threatening. Follow up as needed.  She is advised to start exercising on a regular basis.  If her symptoms worsen betablockers could be considered.   Current medicines are reviewed at length with the patient today.  The patient does not have concerns regarding medicines.  The following changes have been made:  no change  Pt informed that per Dr Meda Coffee her 24 hour holter monitor results were normal.  Pt verbalized understanding.  No orders of the defined types were placed in this encounter.    Signed, Dorothy Spark, MD  09/25/2014 9:43 AM    Kristi Medina, [place], [place]  [address] Phone: [phone]; Fax: [phone]

## 2014-09-25 NOTE — Patient Instructions (Addendum)
Medication Instructions:   Your physician recommends that you continue on your current medications as directed. Please refer to the Current Medication list given to you today.   Labwork: None   Testing/Procedures: None   Follow-Up: As needed

## 2014-10-02 ENCOUNTER — Ambulatory Visit: Payer: BC Managed Care – PPO | Admitting: Cardiology

## 2014-12-31 ENCOUNTER — Ambulatory Visit (INDEPENDENT_AMBULATORY_CARE_PROVIDER_SITE_OTHER): Payer: BC Managed Care – PPO | Admitting: Nurse Practitioner

## 2014-12-31 ENCOUNTER — Encounter: Payer: Self-pay | Admitting: Nurse Practitioner

## 2014-12-31 VITALS — BP 116/78 | Ht 63.0 in | Wt 136.0 lb

## 2014-12-31 DIAGNOSIS — K2100 Gastro-esophageal reflux disease with esophagitis, without bleeding: Secondary | ICD-10-CM

## 2014-12-31 DIAGNOSIS — R1013 Epigastric pain: Secondary | ICD-10-CM | POA: Diagnosis not present

## 2014-12-31 DIAGNOSIS — R197 Diarrhea, unspecified: Secondary | ICD-10-CM | POA: Diagnosis not present

## 2014-12-31 MED ORDER — PANTOPRAZOLE SODIUM 40 MG PO TBEC: 40.0000 mg | DELAYED_RELEASE_TABLET | Freq: Every day | ORAL | Status: DC

## 2014-12-31 MED ORDER — DICYCLOMINE HCL 10 MG PO CAPS: 10.0000 mg | ORAL_CAPSULE | Freq: Three times a day (TID) | ORAL | Status: DC

## 2014-12-31 NOTE — Patient Instructions (Signed)
2 cups of [place] OR [employer]

## 2015-01-01 ENCOUNTER — Encounter: Payer: Self-pay | Admitting: Nurse Practitioner

## 2015-01-01 DIAGNOSIS — K2100 Gastro-esophageal reflux disease with esophagitis, without bleeding: Secondary | ICD-10-CM | POA: Insufficient documentation

## 2015-01-01 LAB — BASIC METABOLIC PANEL
BUN / CREAT RATIO: 13 (ref 8–20)
BUN: 13 mg/dL (ref 6–20)
CHLORIDE: 104 mmol/L (ref 97–108)
CO2: 22 mmol/L (ref 18–29)
Calcium: 9.1 mg/dL (ref 8.7–10.2)
Creatinine, Ser: 1.01 mg/dL — ABNORMAL HIGH (ref 0.57–1.00)
GFR calc Af Amer: 82 mL/min/{1.73_m2} (ref >59)
GFR calc non Af Amer: 71 mL/min/{1.73_m2} (ref >59)
Glucose: 83 mg/dL (ref 65–99)
Potassium: 3.9 mmol/L (ref 3.5–5.2)
Sodium: 142 mmol/L (ref 134–144)

## 2015-01-01 LAB — CBC WITH DIFFERENTIAL/PLATELET
BASOS: 0 %
Basophils Absolute: 0 10*3/uL (ref 0.0–0.2)
EOS (ABSOLUTE): 0.1 10*3/uL (ref 0.0–0.4)
Eos: 1 %
HEMATOCRIT: 41.5 % (ref 34.0–46.6)
HEMOGLOBIN: 13.9 g/dL (ref 11.1–15.9)
IMMATURE GRANS (ABS): 0 10*3/uL (ref 0.0–0.1)
Immature Granulocytes: 0 %
LYMPHS ABS: 2.4 10*3/uL (ref 0.7–3.1)
LYMPHS: 30 %
MCH: 30.5 pg (ref 26.6–33.0)
MCHC: 33.5 g/dL (ref 31.5–35.7)
MCV: 91 fL (ref 79–97)
MONOCYTES: 6 %
MONOS ABS: 0.5 10*3/uL (ref 0.1–0.9)
Neutrophils Absolute: 4.8 10*3/uL (ref 1.4–7.0)
Neutrophils: 63 %
PLATELETS: 234 10*3/uL (ref 150–379)
RBC: 4.56 x10E6/uL (ref 3.77–5.28)
RDW: 12.8 % (ref 12.3–15.4)
WBC: 7.7 10*3/uL (ref 3.4–10.8)

## 2015-01-01 LAB — HEPATIC FUNCTION PANEL
ALBUMIN: 4.2 g/dL (ref 3.5–5.5)
ALT: 14 IU/L (ref 0–32)
AST: 17 IU/L (ref 0–40)
Alkaline Phosphatase: 48 IU/L (ref 39–117)
BILIRUBIN TOTAL: 0.3 mg/dL (ref 0.0–1.2)
BILIRUBIN, DIRECT: 0.08 mg/dL (ref 0.00–0.40)
Total Protein: 6.8 g/dL (ref 6.0–8.5)

## 2015-01-01 LAB — LIPASE: LIPASE: 50 U/L (ref 0–59)

## 2015-01-01 NOTE — Progress Notes (Signed)
Subjective:  Presents for complaints of diarrhea over the past week. Occurs about an hour after eating, unassociated with any particular foods. Describes as a yellow stool, slightly loose, no watery stools. No blood in her stool. No fever. 3 days ago began having an intense cramping feeling and a "knot" in the epigastric area. Radiate somewhat into the left upper abdomen. Nausea, dry heaves at times. Has had occasional reflux for the past 2-3 weeks. No known contacts. No back pain. No urinary symptoms. Patient thought she might be constipated and packed up, used enema and stool softeners with minimal results. Tends to have more constipation in the past. Has decreased caffeine in her diet. Nonsmoker. No alcohol use. Limited NSAID use.  Objective:   BP 116/78 mmHg  Ht 5\' 3"  (1.6 m)  Wt 136 lb (61.689 kg)  BMI 24.10 kg/m2 NAD. Alert, oriented. Cheerful affect. Lungs clear. Heart regular rhythm. Abdomen mildly obese soft nondistended with active bowel sounds 4; distinct upper epigastric area tenderness. Exam otherwise benign. No rebound or guarding. No obvious masses.  Assessment:  Problem List Items Addressed This Visit      Digestive   Gastroesophageal reflux disease with esophagitis - Primary   Relevant Orders   CBC with Differential/Platelet (Completed)   Hepatic function panel (Completed)   Basic metabolic panel (Completed)   Lipase (Completed)    Other Visit Diagnoses    Epigastric pain        Relevant Orders    CBC with Differential/Platelet (Completed)    Hepatic function panel (Completed)    Basic metabolic panel (Completed)    Lipase (Completed)    Diarrhea        Relevant Orders    CBC with Differential/Platelet (Completed)    Hepatic function panel (Completed)    Basic metabolic panel (Completed)    Lipase (Completed)        Plan:  Meds ordered this encounter  Medications  . pantoprazole (PROTONIX) 40 MG tablet    Sig: Take 1 tablet (40 mg total) by mouth daily. Prn  acid reflux    Dispense:  30 tablet    Refill:  2    Order Specific Question:  Supervising Provider    Answer:  Mikey Kirschner [[id number]]  . dicyclomine (BENTYL) 10 MG capsule    Sig: Take 1 capsule (10 mg total) by mouth 4 (four) times daily -  before meals and at bedtime. Prn cramping    Dispense:  40 capsule    Refill:  0    Order Specific Question:  Supervising Provider    Answer:  Mikey Kirschner [[id number]]   Reviewed dietary measures and warning signs. Lab work pending. Further follow-up based on lab results. Call back in the meantime if symptoms worsen. Also recommend daily bowel probiotics.

## 2015-06-09 ENCOUNTER — Other Ambulatory Visit: Payer: Self-pay | Admitting: Adult Health

## 2015-08-22 ENCOUNTER — Ambulatory Visit (INDEPENDENT_AMBULATORY_CARE_PROVIDER_SITE_OTHER): Payer: BC Managed Care – PPO | Admitting: Adult Health

## 2015-08-22 ENCOUNTER — Encounter: Payer: Self-pay | Admitting: Adult Health

## 2015-08-22 ENCOUNTER — Other Ambulatory Visit (HOSPITAL_COMMUNITY)
Admission: RE | Admit: 2015-08-22 | Discharge: 2015-08-22 | Disposition: A | Discharge status: Home / Self Care | Payer: BC Managed Care – PPO | Source: Ambulatory Visit | Attending: Adult Health | Admitting: Adult Health

## 2015-08-22 VITALS — BP 110/70 | HR 70 | Ht 63.5 in | Wt 142.0 lb

## 2015-08-22 DIAGNOSIS — Z01419 Encounter for gynecological examination (general) (routine) without abnormal findings: Secondary | ICD-10-CM | POA: Insufficient documentation

## 2015-08-22 DIAGNOSIS — R319 Hematuria, unspecified: Secondary | ICD-10-CM

## 2015-08-22 DIAGNOSIS — R1011 Right upper quadrant pain: Secondary | ICD-10-CM

## 2015-08-22 DIAGNOSIS — Z1151 Encounter for screening for human papillomavirus (HPV): Secondary | ICD-10-CM | POA: Insufficient documentation

## 2015-08-22 DIAGNOSIS — K5909 Other constipation: Secondary | ICD-10-CM | POA: Insufficient documentation

## 2015-08-22 DIAGNOSIS — E049 Nontoxic goiter, unspecified: Secondary | ICD-10-CM

## 2015-08-22 HISTORY — DX: Other constipation: K59.09

## 2015-08-22 HISTORY — DX: Right upper quadrant pain: R10.11

## 2015-08-22 HISTORY — DX: Nontoxic goiter, unspecified: E04.9

## 2015-08-22 HISTORY — DX: Hematuria, unspecified: R31.9

## 2015-08-22 MED ORDER — DICYCLOMINE HCL 10 MG PO CAPS: 10.0000 mg | ORAL_CAPSULE | Freq: Three times a day (TID) | ORAL | Status: DC

## 2015-08-22 NOTE — Progress Notes (Signed)
Patient ID: Kristi Medina, female   DOB: Mar 27, 1976, 40 y.o.   MRN: [id number] History of Present Illness: Kristi Medina is a 40 year old white female, divorced in for a well woman gyn exam and pap.She is complaining of pain RUQ to back at times and nausea.She has chronic constipation.She did urine at work was + for blood and calcium oxalate crystals.She is using lo loestrin to stop periods, had ablation and still bleeds some.  PCP is Dr Wolfgang Phoenix.    Current Medications, Allergies, Past Medical History, Past Surgical History, Family History and Social History were reviewed in [facility] record.     Review of Systems: Patient denies any headaches, hearing loss, fatigue, blurred vision, shortness of breath, chest pain, problems with bowel movements, urination, or intercourse. No joint pain or mood swings. See HPI for positives.   Physical Exam:BP 110/70 mmHg  Pulse 70  Ht 5' 3.5" (1.613 m)  Wt 142 lb (64.411 kg)  BMI 24.76 kg/m2 General:  Well developed, well nourished, no acute distress Skin:  Warm and dry Neck:  Midline trachea, thyroid enlarged, left nodule, good ROM, no lymphadenopathy Lungs; Clear to auscultation bilaterally Breast:  No dominant palpable mass, retraction, or nipple discharge Cardiovascular: Regular rate and rhythm Abdomen:  Soft, mildly tender in RUQ and mid epi gastirc area, no hepatosplenomegaly Pelvic:  External genitalia is normal in appearance, no lesions.  The vagina is normal in appearance. Urethra has no lesions or masses. The cervix is bulbous.Pap with HPV performed.  Uterus is felt to be normal size, shape, and contour.  No adnexal masses or tenderness noted.Bladder is non tender, no masses felt. Extremities/musculoskeletal:  No swelling or varicosities noted, no clubbing or cyanosis Psych:  No mood changes, alert and cooperative,seems happy   Impression: Well woman gyn exam and pap RUQ pain and nausea Blood in urine Thyroid enlarged   Chronic constipation      Plan: Check CBC,CMP,TSH and vitamin D, lipase and free T4 Abdominal US 3/21 at 8:45 am at [facility] in 1 year, pap in 3 if normal Mammogram at 40 Rx bentyl 10 mg #90 take 1 tid prn with 1 refill

## 2015-08-22 NOTE — Patient Instructions (Signed)
Physical in 1 year, pap in 3 if normal Mammogram at 40 Get [place] 3/21 at 8:30 at [place]

## 2015-08-23 LAB — COMPREHENSIVE METABOLIC PANEL
ALT: 16 IU/L (ref 0–32)
AST: 16 IU/L (ref 0–40)
Albumin/Globulin Ratio: 1.8 (ref 1.2–2.2)
Albumin: 4.3 g/dL (ref 3.5–5.5)
Alkaline Phosphatase: 49 IU/L (ref 39–117)
BILIRUBIN TOTAL: 0.2 mg/dL (ref 0.0–1.2)
BUN/Creatinine Ratio: 16 (ref 8–20)
BUN: 15 mg/dL (ref 6–20)
CALCIUM: 9.1 mg/dL (ref 8.7–10.2)
CO2: 22 mmol/L (ref 18–29)
Chloride: 103 mmol/L (ref 96–106)
Creatinine, Ser: 0.96 mg/dL (ref 0.57–1.00)
GFR calc Af Amer: 86 mL/min/{1.73_m2} (ref >59)
GFR, EST NON AFRICAN AMERICAN: 75 mL/min/{1.73_m2} (ref >59)
Globulin, Total: 2.4 g/dL (ref 1.5–4.5)
Glucose: 93 mg/dL (ref 65–99)
Potassium: 4 mmol/L (ref 3.5–5.2)
Sodium: 141 mmol/L (ref 134–144)
Total Protein: 6.7 g/dL (ref 6.0–8.5)

## 2015-08-23 LAB — CBC
Hematocrit: 39.8 % (ref 34.0–46.6)
Hemoglobin: 13.4 g/dL (ref 11.1–15.9)
MCH: 30.7 pg (ref 26.6–33.0)
MCHC: 33.7 g/dL (ref 31.5–35.7)
MCV: 91 fL (ref 79–97)
PLATELETS: 238 10*3/uL (ref 150–379)
RBC: 4.36 x10E6/uL (ref 3.77–5.28)
RDW: 13.2 % (ref 12.3–15.4)
WBC: 6.3 10*3/uL (ref 3.4–10.8)

## 2015-08-23 LAB — T4, FREE: FREE T4: 1.25 ng/dL (ref 0.82–1.77)

## 2015-08-23 LAB — VITAMIN D 25 HYDROXY (VIT D DEFICIENCY, FRACTURES): VIT D 25 HYDROXY: 32.8 ng/mL (ref 30.0–100.0)

## 2015-08-23 LAB — LIPASE: Lipase: 48 U/L (ref 0–59)

## 2015-08-23 LAB — TSH: TSH: 2.13 u[IU]/mL (ref 0.450–4.500)

## 2015-08-26 ENCOUNTER — Telehealth: Payer: Self-pay | Admitting: Adult Health

## 2015-08-26 ENCOUNTER — Ambulatory Visit (HOSPITAL_COMMUNITY)
Admission: RE | Admit: 2015-08-26 | Discharge: 2015-08-26 | Disposition: A | Discharge status: Home / Self Care | Payer: BC Managed Care – PPO | Source: Ambulatory Visit | Attending: Adult Health | Admitting: Adult Health

## 2015-08-26 DIAGNOSIS — R1011 Right upper quadrant pain: Secondary | ICD-10-CM | POA: Insufficient documentation

## 2015-08-26 DIAGNOSIS — R319 Hematuria, unspecified: Secondary | ICD-10-CM | POA: Insufficient documentation

## 2015-08-26 NOTE — Telephone Encounter (Signed)
Left message [place] of abdomen was normal

## 2015-08-26 NOTE — Telephone Encounter (Signed)
Pt aware labs normal  

## 2015-08-27 LAB — CYTOLOGY - PAP

## 2015-08-29 ENCOUNTER — Telehealth: Payer: Self-pay | Admitting: Adult Health

## 2015-08-29 MED ORDER — LUBIPROSTONE 24 MCG PO CAPS: ORAL_CAPSULE | ORAL | Status: DC

## 2015-08-29 NOTE — Telephone Encounter (Signed)
She wants to try amitiza, will rx

## 2015-11-10 ENCOUNTER — Other Ambulatory Visit: Payer: Self-pay | Admitting: Adult Health

## 2015-11-10 MED ORDER — LUBIPROSTONE 8 MCG PO CAPS: 8.0000 ug | ORAL_CAPSULE | Freq: Every day | ORAL | Status: DC

## 2015-11-18 ENCOUNTER — Other Ambulatory Visit: Payer: Self-pay | Admitting: Nurse Practitioner

## 2015-11-18 ENCOUNTER — Encounter: Payer: Self-pay | Admitting: Nurse Practitioner

## 2015-11-18 MED ORDER — PANTOPRAZOLE SODIUM 40 MG PO TBEC
40.0000 mg | DELAYED_RELEASE_TABLET | Freq: Every day | ORAL | Status: DC
Start: 2015-11-18 — End: 2016-09-07

## 2016-02-22 ENCOUNTER — Encounter: Payer: Self-pay | Admitting: Family Medicine

## 2016-05-18 ENCOUNTER — Other Ambulatory Visit: Payer: Self-pay | Admitting: Adult Health

## 2016-05-18 DIAGNOSIS — Z1231 Encounter for screening mammogram for malignant neoplasm of breast: Secondary | ICD-10-CM

## 2016-06-14 ENCOUNTER — Ambulatory Visit
Admission: RE | Admit: 2016-06-14 | Discharge: 2016-06-14 | Disposition: A | Discharge status: Home / Self Care | Payer: 59 | Source: Ambulatory Visit | Attending: Adult Health | Admitting: Adult Health

## 2016-06-14 DIAGNOSIS — Z1231 Encounter for screening mammogram for malignant neoplasm of breast: Secondary | ICD-10-CM | POA: Diagnosis not present

## 2016-07-03 ENCOUNTER — Other Ambulatory Visit: Payer: Self-pay | Admitting: Adult Health

## 2016-09-07 ENCOUNTER — Ambulatory Visit (INDEPENDENT_AMBULATORY_CARE_PROVIDER_SITE_OTHER): Payer: 59 | Admitting: Adult Health

## 2016-09-07 ENCOUNTER — Encounter: Payer: Self-pay | Admitting: Adult Health

## 2016-09-07 VITALS — BP 122/78 | HR 86 | Ht 63.5 in | Wt 133.0 lb

## 2016-09-07 DIAGNOSIS — Z7689 Persons encountering health services in other specified circumstances: Secondary | ICD-10-CM | POA: Insufficient documentation

## 2016-09-07 DIAGNOSIS — Z01419 Encounter for gynecological examination (general) (routine) without abnormal findings: Secondary | ICD-10-CM | POA: Diagnosis not present

## 2016-09-07 DIAGNOSIS — Z1212 Encounter for screening for malignant neoplasm of rectum: Secondary | ICD-10-CM

## 2016-09-07 DIAGNOSIS — Z1211 Encounter for screening for malignant neoplasm of colon: Secondary | ICD-10-CM | POA: Diagnosis not present

## 2016-09-07 DIAGNOSIS — N898 Other specified noninflammatory disorders of vagina: Secondary | ICD-10-CM | POA: Diagnosis not present

## 2016-09-07 DIAGNOSIS — E049 Nontoxic goiter, unspecified: Secondary | ICD-10-CM | POA: Diagnosis not present

## 2016-09-07 LAB — HEMOCCULT GUIAC POC 1CARD (OFFICE): FECAL OCCULT BLD: NEGATIVE

## 2016-09-07 NOTE — Progress Notes (Signed)
Patient ID: Kristi Medina, female   DOB: [birth year], 41 y.o.   MRN: [id number] History of Present Illness: Kristi Medina is a 42 year old white female, divorced, G2P2, in for well woman gyn exam, she had normal pap with negative HPV 08/22/15. She is working as Therapist, sports at [employer].  PCP is Dr Mickie Hillier.   Current Medications, Allergies, Past Medical History, Past Surgical History, Family History and Social History were reviewed in [facility] record.     Review of Systems: Patient denies any headaches, hearing loss, fatigue, blurred vision, shortness of breath, chest pain, abdominal pain, problems with bowel movements, urination, or intercourse(not currently). No joint pain or mood swings. Vaginal discharge at times.   Physical Exam:BP 122/78 (BP Location: Left Arm, Patient Position: Sitting, Cuff Size: Normal)   Pulse 86   Ht 5' 3.5" (1.613 m)   Wt 133 lb (60.3 kg)   BMI 23.19 kg/m  General:  Well developed, well nourished, no acute distress Skin:  Warm and dry Neck:  Midline trachea, enlarged thyroid, good ROM, no lymphadenopathy Lungs; Clear to auscultation bilaterally Breast:  No dominant palpable mass, retraction, or nipple discharge Cardiovascular: Regular rate and rhythm Abdomen:  Soft, non tender, no hepatosplenomegaly Pelvic:  External genitalia is normal in appearance, no lesions.  The vagina is normal in appearance. Urethra has no lesions or masses. The cervix is bulbous.  Uterus is felt to be normal size, shape, and contour.  No adnexal masses or tenderness noted.Bladder is non tender, no masses felt.GC/CHL obatined. Rectal: Good sphincter tone, no polyps, or hemorrhoids felt.  Hemoccult negative. Extremities/musculoskeletal:  No swelling or varicosities noted, no clubbing or cyanosis Psych:  No mood changes, alert and cooperative,seems happy PHQ 2 score 0.  Impression: 1. Well woman exam with routine gynecological exam   2. Enlarged thyroid   3. Screening for  colorectal cancer   4. Vaginal discharge   5. Encounter for menstrual regulation       Plan: Check TSH and free T4 GC/CHL sent Continue lo loestrin has refills Physical in 1 year Pap in 2020 Mammogram yearly

## 2016-09-08 LAB — T4, FREE: FREE T4: 1.35 ng/dL (ref 0.82–1.77)

## 2016-09-08 LAB — TSH: TSH: 2.23 u[IU]/mL (ref 0.450–4.500)

## 2016-09-09 LAB — GC/CHLAMYDIA PROBE AMP
Chlamydia trachomatis, NAA: NEGATIVE
Neisseria gonorrhoeae by PCR: NEGATIVE

## 2016-12-20 ENCOUNTER — Ambulatory Visit: Payer: 59 | Admitting: Adult Health

## 2017-01-06 ENCOUNTER — Telehealth: Payer: Self-pay | Admitting: Adult Health

## 2017-01-06 MED ORDER — FLUCONAZOLE 150 MG PO TABS: ORAL_TABLET | ORAL | 1 refills | Status: DC

## 2017-01-06 NOTE — Telephone Encounter (Signed)
Pt thinks she has yeast infection after had used metrogel, will rx diflucan

## 2017-02-16 DIAGNOSIS — Z23 Encounter for immunization: Secondary | ICD-10-CM | POA: Diagnosis not present

## 2017-04-18 DIAGNOSIS — H52223 Regular astigmatism, bilateral: Secondary | ICD-10-CM | POA: Diagnosis not present

## 2017-05-19 DIAGNOSIS — M76811 Anterior tibial syndrome, right leg: Secondary | ICD-10-CM | POA: Diagnosis not present

## 2017-05-19 DIAGNOSIS — M7751 Other enthesopathy of right foot: Secondary | ICD-10-CM | POA: Diagnosis not present

## 2017-05-19 DIAGNOSIS — M659 Synovitis and tenosynovitis, unspecified: Secondary | ICD-10-CM | POA: Diagnosis not present

## 2017-05-26 DIAGNOSIS — M71571 Other bursitis, not elsewhere classified, right ankle and foot: Secondary | ICD-10-CM | POA: Diagnosis not present

## 2017-05-26 DIAGNOSIS — M76821 Posterior tibial tendinitis, right leg: Secondary | ICD-10-CM | POA: Diagnosis not present

## 2017-06-02 DIAGNOSIS — M71571 Other bursitis, not elsewhere classified, right ankle and foot: Secondary | ICD-10-CM | POA: Diagnosis not present

## 2017-06-02 DIAGNOSIS — M76821 Posterior tibial tendinitis, right leg: Secondary | ICD-10-CM | POA: Diagnosis not present

## 2017-06-08 ENCOUNTER — Other Ambulatory Visit: Payer: Self-pay | Admitting: Adult Health

## 2017-06-08 DIAGNOSIS — Z1231 Encounter for screening mammogram for malignant neoplasm of breast: Secondary | ICD-10-CM

## 2017-07-04 ENCOUNTER — Ambulatory Visit
Admission: RE | Admit: 2017-07-04 | Discharge: 2017-07-04 | Disposition: A | Discharge status: Home / Self Care | Payer: 59 | Source: Ambulatory Visit | Attending: Adult Health | Admitting: Adult Health

## 2017-07-04 DIAGNOSIS — Z1231 Encounter for screening mammogram for malignant neoplasm of breast: Secondary | ICD-10-CM | POA: Diagnosis not present

## 2017-07-06 ENCOUNTER — Encounter: Payer: Self-pay | Admitting: Endocrinology

## 2017-07-06 ENCOUNTER — Ambulatory Visit: Payer: 59 | Admitting: Endocrinology

## 2017-07-06 VITALS — BP 108/75 | HR 83 | Temp 99.2°F | Ht 63.5 in | Wt 139.0 lb

## 2017-07-06 DIAGNOSIS — J449 Chronic obstructive pulmonary disease, unspecified: Secondary | ICD-10-CM | POA: Diagnosis not present

## 2017-07-06 DIAGNOSIS — R062 Wheezing: Secondary | ICD-10-CM

## 2017-07-06 DIAGNOSIS — E049 Nontoxic goiter, unspecified: Secondary | ICD-10-CM

## 2017-07-06 LAB — T4, FREE: Free T4: 1.07 ng/dL (ref 0.60–1.60)

## 2017-07-06 LAB — TSH: TSH: 1.62 u[IU]/mL (ref 0.35–4.50)

## 2017-07-06 NOTE — Progress Notes (Signed)
Subjective:    Patient ID: Kristi Medina, female    DOB: [birth year], 42 y.o.   MRN: [id number]  HPI Pt returns for f/u of multinodular goiter (dx'ed 2007; antibodies were pos).  she was last seen here in early 2016.  she was rx'ed synthroid, but this caused fatigue and tremor, so she stopped it.  she had bxs of bilat nodules in 2010 (pt says benign, but results are not available; she has since been euthyroid off any rx).  pt states slight wheezing sensation in the neck, and assoc tremor.   Past Medical History:  Diagnosis Date  . ALLERGIC RHINITIS 06/11/2010  . Blood in urine 08/22/2015  . BV (bacterial vaginosis) 12/05/2012  . Constipation, chronic 08/22/2015  . Enlarged thyroid 08/22/2015  . GOITER, MULTINODULAR 06/11/2010  . RUQ pain 08/22/2015  . THYROIDITIS 06/11/2010    Past Surgical History:  Procedure Laterality Date  . ENDOMETRIAL ABLATION  2006   for menorrhagia  . TUBAL LIGATION  2006    Social History   Socioeconomic History  . Marital status: Divorced    Spouse name: Not on file  . Number of children: Not on file  . Years of education: Not on file  . Highest education level: Not on file  Social Needs  . Financial resource strain: Not on file  . Food insecurity - worry: Not on file  . Food insecurity - inability: Not on file  . Transportation needs - medical: Not on file  . Transportation needs - non-medical: Not on file  Occupational History  . Occupation: Nurse    Employer: [employer]  Tobacco Use  . Smoking status: Never Smoker  . Smokeless tobacco: Never Used  Substance and Sexual Activity  . Alcohol use: Yes    Comment: once in a blue moon  . Drug use: No  . Sexual activity: Not Currently    Birth control/protection: Surgical, Pill    Comment: ablation and tubal  Other Topics Concern  . Not on file  Social History Narrative   Regular exercise-no    Current Outpatient Medications on File Prior to Visit  Medication Sig Dispense Refill  . LO LOESTRIN FE 1  MG-10 MCG / 10 MCG tablet TAKE ONE TABLET BY MOUTH ONCE DAILY 28 tablet 12  . montelukast (SINGULAIR) 10 MG tablet Take 10 mg by mouth daily.     . fluconazole (DIFLUCAN) 150 MG tablet Take 1 now and repeat 1 in 3 days if needed (Patient not taking: Reported on 07/06/2017) 2 tablet 1   No current facility-administered medications on file prior to visit.     No Known Allergies  Family History  Problem Relation Age of Onset  . Thyroid disease Brother        had overactive thyroid  . Hypertension Mother   . Diabetes Father   . Stroke Father   . Hypertension Father   . Goiter Other        [employer]  . Alcohol abuse Other   . Diabetes Other        1st degree Relative  . Hypertension Other   . Stroke Other        Parent  . Cancer Other        Breast Cancer-Grandparent  . Cancer Other        Colon Cancer-Grandparent    BP 108/75 (BP Location: Left Arm, Patient Position: Sitting, Cuff Size: Normal)   Pulse 83   Temp 99.2 F (37.3 C) (Oral)  Ht 5' 3.5" (1.613 m)   Wt 139 lb (63 kg)   SpO2 97%   BMI 24.24 kg/m    Review of Systems She has intermitt palpitations    Objective:   Physical Exam VITAL SIGNS:  See vs page GENERAL: no distress NECK: Thyroid is approx twice normal size, with irreg surface LUNGS:  Clear to auscultation HEART:  Regular rate and rhythm without murmurs noted. Normal S1,S2.   Neuro: slight tremor of the hands.       Assessment & Plan:  Wheezing, new Goiter: due for recheck.  Patient Instructions  Let's recheck the ultrasound.  you will receive a phone call, about a day and time for an appointment.  blood tests are requested for you today.  We'll let you know about the results. Let's also check the breathing test.  You can do lying down.  Please come back for a follow-up appointment in 2 years.

## 2017-07-06 NOTE — Patient Instructions (Addendum)
Let's recheck the ultrasound.  you will receive a phone call, about a day and time for an appointment.  blood tests are requested for you today.  We'll let you know about the results. Let's also check the breathing test.  You can do lying down.  Please come back for a follow-up appointment in 2 years.

## 2017-07-07 ENCOUNTER — Ambulatory Visit: Payer: 59

## 2017-07-07 VITALS — BP 108/76 | HR 85 | Wt 139.0 lb

## 2017-07-07 DIAGNOSIS — R062 Wheezing: Secondary | ICD-10-CM

## 2017-07-07 LAB — THYROID PEROXIDASE ANTIBODY: THYROID PEROXIDASE ANTIBODY: 215 [IU]/mL — AB (ref <9)

## 2017-07-13 ENCOUNTER — Ambulatory Visit
Admission: RE | Admit: 2017-07-13 | Discharge: 2017-07-13 | Disposition: A | Discharge status: Home / Self Care | Payer: 59 | Source: Ambulatory Visit | Attending: Endocrinology | Admitting: Endocrinology

## 2017-07-13 DIAGNOSIS — E041 Nontoxic single thyroid nodule: Secondary | ICD-10-CM | POA: Diagnosis not present

## 2017-07-13 DIAGNOSIS — E049 Nontoxic goiter, unspecified: Secondary | ICD-10-CM

## 2017-07-14 ENCOUNTER — Encounter: Payer: Self-pay | Admitting: Endocrinology

## 2017-07-14 NOTE — Telephone Encounter (Signed)
LM for pt to call back to let her know the referral is with  Pulmonary

## 2017-08-09 ENCOUNTER — Ambulatory Visit: Payer: 59 | Admitting: Internal Medicine

## 2017-08-09 ENCOUNTER — Encounter: Payer: Self-pay | Admitting: Internal Medicine

## 2017-08-09 VITALS — BP 110/70 | HR 86 | Ht 63.5 in | Wt 141.0 lb

## 2017-08-09 DIAGNOSIS — J45991 Cough variant asthma: Secondary | ICD-10-CM | POA: Insufficient documentation

## 2017-08-09 LAB — NITRIC OXIDE: NITRIC OXIDE: 12

## 2017-08-09 MED ORDER — PREDNISONE 10 MG PO TABS: ORAL_TABLET | ORAL | 0 refills | Status: DC

## 2017-08-09 MED ORDER — FAMOTIDINE 20 MG PO TABS
ORAL_TABLET | ORAL | 2 refills | Status: DC
Start: 2017-08-09 — End: 2019-03-07

## 2017-08-09 MED ORDER — PANTOPRAZOLE SODIUM 40 MG PO TBEC: 40.0000 mg | DELAYED_RELEASE_TABLET | Freq: Every day | ORAL | 2 refills | Status: DC

## 2017-08-09 NOTE — Patient Instructions (Addendum)
Stop singulair   You may need to seriously consider an alternative to loestrin that minimizes if possible your exposure to progestrerone  GERD (REFLUX)  is an extremely common cause of respiratory symptoms just like yours , many times with no obvious heartburn at all.    It can be treated with medication, but also with lifestyle changes including elevation of the head of your bed (ideally with 6 inch  bed blocks),  Smoking cessation, avoidance of late meals, excessive alcohol, and avoid fatty foods, chocolate, peppermint, colas, red wine, and acidic juices such as orange juice.  NO MINT OR MENTHOL PRODUCTS SO NO COUGH DROPS   USE SUGARLESS CANDY INSTEAD (Jolley ranchers or Stover's or Life Savers) or even ice chips will also do - the key is to swallow to prevent all throat clearing. NO OIL BASED VITAMINS - use powdered substitutes.    Pantoprazole (protonix) 40 mg   Take  30-60 min before first meal of the day and Pepcid (famotidine)  20 mg right after supper  bedtime until return to office - this is the best way to tell whether stomach acid is contributing to your problem.    For drainage / throat tickle try take CHLORPHENIRAMINE  4 mg - take one every 4 hours as needed - available over the counter- may cause drowsiness so start with just a bedtime dose or two and see how you tolerate it before trying in daytime    Prednisone 10 mg take  4 each am x 2 days,   2 each am x 2 days,  1 each am x 2 days and stop (take with bfast)   Please schedule a follow up office visit in 4 weeks, sooner if needed  with all medications /inhalers/ solutions in hand so we can verify exactly what you are taking. This includes all medications from all doctors and over the counters

## 2017-08-09 NOTE — Progress Notes (Signed)
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 Asthma.     08/09/2017 1st [facility] office visit/ [facility]   Chief Complaint  Patient presents with  . Pulmonary Consult    Referred by Dr. Renato Shin. She c/o wheezing when she lies down for the past 6-9 months.   worst symptoms of pnds are in the fall but really year round x 10 y prior to OV   Does kick boxing ok s sob or cough related to ex      Kouffman Reflux v Neurogenic Cough Differentiator Reflux Comments  Do you awaken from a sound sleep coughing violently?                            With trouble breathing? Never no   Do you have choking episodes when you cannot  Get enough air, gasping for air ?              no   Do you usually cough when you lie down into  The bed, or when you just lie down to rest ?                          Wheeze but no urge cough   Do you usually cough after meals or eating?         Yes    Do you cough when (or after) you bend over?    no   GERD SCORE     Kouffman Reflux v Neurogenic Cough Differentiator Neurogenic   Do you more-or-less cough all day long? Sporadic throat clearing   Does change of temperature make you cough? no   Does laughing or chuckling cause you to cough? yes   Do fumes (perfume, automobile fumes, burned  Toast, etc.,) cause you to cough ?      no   Does speaking, singing, or talking on the phone cause you to cough   ?               No    Neurogenic/Airway score       No other obvious day to day or daytime variability or assoc excess/ purulent sputum or  mucus plugs or hemoptysis or cp or chest tightness, subjective wheeze or overt sinus or hb symptoms. No unusual exposure hx or h/o childhood pna/ asthma or knowledge of premature birth.  Sleeping ok flat without nocturnal  or early am exacerbation  of respiratory  c/o's or need for noct saba. Also denies any obvious fluctuation of symptoms with weather or environmental changes or other aggravating or alleviating factors except as outlined above   Current Allergies, Complete Past Medical History, Past Surgical History, Family History, and Social History were reviewed in [facility] record.  ROS  The following are not active complaints unless bolded Hoarseness, sore throat, dysphagia, dental problems, itching, sneezing,  nasal congestion or discharge of excess mucus  or purulent secretions, ear ache,   fever, chills, sweats, unintended wt loss or wt gain, classically pleuritic or exertional cp,  orthopnea pnd or leg swelling, presyncope, palpitations, abdominal pain, anorexia, nausea, vomiting, diarrhea  or change in bowel habits or change in bladder habits, change in stools or change in urine, dysuria, hematuria,  rash, arthralgias, visual complaints, headache, numbness, weakness or ataxia or problems with walking or coordination,  change in mood/affect or memory.        Current Meds  Medication Sig  . LO LOESTRIN FE 1 MG-10 MCG / 10 MCG tablet TAKE ONE TABLET BY MOUTH ONCE DAILY  . [DISCONTINUED] montelukast (SINGULAIR) 10 MG tablet Take 10 mg by mouth daily.        Review of Systems     Objective:   Physical Exam    amb wf nad with freq throat clearing    Wt Readings from Last 3 Encounters:  08/09/17 141 lb (64 kg)  07/07/17 139 lb (63 kg)  07/06/17 139 lb (63 kg)     Vital signs reviewed - Note on arrival 02 sats  97% on RA    HEENT: nl dentition, turbinates bilaterally, and oropharynx. Nl external ear canals without cough reflex   NECK :  without  JVD/Nodes/TM/ nl carotid upstrokes bilaterally   LUNGS: no acc muscle use,  Nl contour chest which is clear to A and P bilaterally without cough on insp or exp maneuvers   CV:  RRR  no s3 or murmur or increase in P2, and no edema   ABD:  soft and nontender with nl inspiratory excursion in the supine position. No bruits or organomegaly appreciated, bowel sounds nl  MS:  Nl gait/ ext warm without deformities, calf tenderness, cyanosis or clubbing No obvious joint restrictions   SKIN: warm and dry without lesions    NEURO:  alert, approp, nl sensorium with  no motor or cerebellar deficits apparent.             Assessment:

## 2017-08-10 ENCOUNTER — Encounter: Payer: Self-pay | Admitting: Internal Medicine

## 2017-08-10 NOTE — Assessment & Plan Note (Addendum)
- Spirometry 07/07/2017  Flattening of effort dep portion of f/v loop (done in supine position to reproduce wheezing in that position)  - Spirometry 08/09/2017   wnl s curvature - FENO 08/09/2017  =   12 on singulair with no improvement in symptoms so d/c'd   The most common causes of chronic cough in immunocompetent adults include the following: upper airway cough syndrome (UACS), previously referred to as postnasal drip syndrome (PNDS), which is caused by variety of rhinosinus conditions; (2) asthma; (3) GERD; (4) chronic bronchitis from cigarette smoking or other inhaled environmental irritants; (5) nonasthmatic eosinophilic bronchitis; and (6) bronchiectasis.   These conditions, singly or in combination, have accounted for up to 94% of the causes of chronic cough in prospective studies.   Other conditions have constituted no >6% of the causes in prospective studies These have included bronchogenic carcinoma, chronic interstitial pneumonia, sarcoidosis, left ventricular failure, ACEI-induced cough, and aspiration from a condition associated with pharyngeal dysfunction.    Chronic cough is often simultaneously caused by more than one condition. A single cause has been found from 38 to 82% of the time, multiple causes from 18 to 62%. Multiply caused cough has been the result of three diseases up to 42% of the time.       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   These are the  same pts (now being labeled as having "irritable larynx syndrome" by some cough centers) who not infrequently have a history of having failed to tolerate ace inhibitors,  dry powder inhalers or biphosphonates or report having atypical/extraesophageal reflux symptoms that don't respond to standard doses of PPI  and are easily confused as having aecopd or asthma flares by even experienced allergists/ pulmonologists (myself included).    Of the three most common causes of  Sub-acute or recurrent or chronic cough, only one (GERD, for which she is at risk due to longterm use of progesterone which was started shortly before the problem began)  can actually contribute to/ trigger  the other two (asthma and post nasal drip syndrome)  and perpetuate the cylce of cough.  While not intuitively obvious, many patients with chronic low grade reflux do not cough until there is a primary insult that disturbs the protective epithelial barrier and exposes sensitive nerve endings.   This is typically viral but or due to chronic nonspecific pnds which is likely the case here.      The point is that once this occurs, it is difficult to eliminate the cycle  using anything but a maximally effective acid suppression regimen at least in the short run, accompanied by an appropriate diet to address non acid GERD and control pnds with 1st gen H1 blockers per guidelines    So rec short course pred for inflammatory component, stop singulair and max rx for gerd/ pnds as per avs    Total time devoted to counseling  > 50 % of initial 60 min office visit:  review case with pt/ discussion of options/alternatives/ personally creating written customized instructions  in presence of pt  then going over those specific  Instructions directly with the pt including how to use all of the meds but in particular covering each new medication in detail and the difference between the maintenance= "automatic" meds and the prns using an action plan  format for the latter (If this problem/symptom => do that organization reading Left to right).  Please see AVS from this visit for a full list of these instructions which I personally wrote for this pt and  are unique to this visit.

## 2017-09-09 ENCOUNTER — Ambulatory Visit: Payer: 59 | Admitting: Internal Medicine

## 2017-09-12 ENCOUNTER — Ambulatory Visit (INDEPENDENT_AMBULATORY_CARE_PROVIDER_SITE_OTHER)
Admission: RE | Admit: 2017-09-12 | Discharge: 2017-09-12 | Disposition: A | Discharge status: Home / Self Care | Payer: 59 | Source: Ambulatory Visit | Attending: Internal Medicine | Admitting: Internal Medicine

## 2017-09-12 ENCOUNTER — Other Ambulatory Visit (INDEPENDENT_AMBULATORY_CARE_PROVIDER_SITE_OTHER): Payer: 59

## 2017-09-12 ENCOUNTER — Ambulatory Visit: Payer: 59 | Admitting: Internal Medicine

## 2017-09-12 ENCOUNTER — Encounter: Payer: Self-pay | Admitting: Internal Medicine

## 2017-09-12 VITALS — BP 118/70 | HR 84 | Ht 63.0 in | Wt 143.6 lb

## 2017-09-12 DIAGNOSIS — J45991 Cough variant asthma: Secondary | ICD-10-CM | POA: Diagnosis not present

## 2017-09-12 LAB — NITRIC OXIDE: Nitric Oxide: 16

## 2017-09-12 LAB — CBC WITH DIFFERENTIAL/PLATELET
Basophils Absolute: 0 10*3/uL (ref 0.0–0.1)
Basophils Relative: 0.5 % (ref 0.0–3.0)
EOS PCT: 0.9 % (ref 0.0–5.0)
Eosinophils Absolute: 0.1 10*3/uL (ref 0.0–0.7)
HCT: 40.8 % (ref 36.0–46.0)
Hemoglobin: 14 g/dL (ref 12.0–15.0)
LYMPHS ABS: 1.7 10*3/uL (ref 0.7–4.0)
Lymphocytes Relative: 28 % (ref 12.0–46.0)
MCHC: 34.4 g/dL (ref 30.0–36.0)
MCV: 92.4 fl (ref 78.0–100.0)
MONOS PCT: 7.2 % (ref 3.0–12.0)
Monocytes Absolute: 0.4 10*3/uL (ref 0.1–1.0)
NEUTROS ABS: 3.9 10*3/uL (ref 1.4–7.7)
NEUTROS PCT: 63.4 % (ref 43.0–77.0)
PLATELETS: 231 10*3/uL (ref 150.0–400.0)
RBC: 4.42 Mil/uL (ref 3.87–5.11)
RDW: 13.1 % (ref 11.5–15.5)
WBC: 6.2 10*3/uL (ref 4.0–10.5)

## 2017-09-12 MED ORDER — AZELASTINE-FLUTICASONE 137-50 MCG/ACT NA SUSP: 1.0000 | Freq: Two times a day (BID) | NASAL | 11 refills | Status: DC

## 2017-09-12 NOTE — Assessment & Plan Note (Addendum)
-   Spirometry 07/07/2017  Flattening of effort dep portion of f/v loop (done in supine position to reproduce wheezing in that position)  - Spirometry 08/09/2017   wnl s curvature FENO 08/09/2017  =   12 on singulair with no improvement in symptoms so d/c'd FENO 09/12/2017  =    16 off singulair  - Allergy profile 09/12/2017 >  Eos 0.1 /  IgE  Pending   Lack of cough resolution on a verified empirical regimen could mean an alternative diagnosis, persistence of the disease state (eg sinusitis or bronchiectasis) , or inadequacy of currently available therapy (eg no medical rx available for non-acid gerd- which I strongly favor here due to use of bcp's which she plans to discuss with gyn shortly)   Since the main problem is pnds and can't tol daytime 1st gen H1 blockers per guidelines rec trial of dymista if affordable and sinus CT is next step with trial of gabapentin for irritable larynx vs refer to [facility] next   I had an extended discussion with the patient reviewing all relevant studies completed to date and  lasting 15 to 20 minutes of a 25 minute visit    Each maintenance medication was reviewed in detail including most importantly the difference between maintenance and prns and under what circumstances the prns are to be triggered using an action plan format that is not reflected in the computer generated alphabetically organized AVS.    Please see AVS for specific instructions unique to this visit that I personally wrote and verbalized to the the pt in detail and then reviewed with pt  by my nurse highlighting any  changes in therapy recommended at today's visit to their plan of care.

## 2017-09-12 NOTE — Progress Notes (Signed)
Left detailed msg with results

## 2017-09-12 NOTE — Patient Instructions (Addendum)
Try taking chlorpheniramine an hour or two before bed to see if still sleepy when wake up and if not ok to take 2  Try dymista one twice daily each nostril - point to ear on same side  Don't change the reflux meds yet but discuss options for getting off your progesterone with your gyn doc  Please remember to go to the lab and x-ray department downstairs in the basement  for your tests - we will call you with the results when they are available.     Please schedule a follow up office visit in 4 weeks, sooner if needed  -add  consider sinus ct then refer to [facility] vs gabapentin trial next

## 2017-09-12 NOTE — Progress Notes (Addendum)
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 Asthma.     08/09/2017 1st [facility] office visit/ Kristi Medina   Chief Complaint  Patient presents with  . Pulmonary Consult    Referred by Dr. Renato Medina. She c/o wheezing when she lies down for the past 6-9 months.   worst symptoms of pnds are in the fall but really year round x 10 y prior to OV   Does kick boxing ok s sob or cough related to ex   Kristi Medina Reflux v Neurogenic Cough Differentiator Reflux Comments  Do you awaken from a sound sleep coughing violently?                            With trouble breathing? Never no   Do you have choking episodes when you cannot  Get enough air, gasping for air ?              no   Do you usually cough when you lie down into  The bed, or when you just lie down to rest ?                          Wheeze but no urge cough   Do you usually cough after meals or eating?         Yes    Do you cough when (or after) you bend over?    no   GERD SCORE     Kristi Medina Reflux v Neurogenic Cough Differentiator Neurogenic   Do you more-or-less cough all day long? Sporadic throat clearing   Does change of temperature make you cough? no   Does laughing or chuckling cause you to cough? yes   Do fumes (perfume, automobile fumes, burned  Toast, etc.,) cause you to cough ?      no   Does speaking, singing, or talking on the phone cause you to cough   ?               No    Neurogenic/Airway score       rec Stop singulair  You may need to seriously consider an alternative  to loestrin that minimizes if possible your exposure to progestrerone GERD diet   Pantoprazole (protonix) 40 mg   Take  30-60 min before first meal of the day and Pepcid (famotidine)  20 mg right after supper  bedtime until return to office - this is the best way to tell whether stomach acid is contributing to your problem.   For drainage / throat tickle try take CHLORPHENIRAMINE  4 mg - take one every 4 hours as needed   Prednisone 10 mg take  4 each am x 2 days,   2 each am x 2 days,  1 each am x 2 days and stop (take with bfast)    09/12/2017  f/u ov/Kristi Medina re:  Cough x 10  years no change / no worse off singulair Chief Complaint  Patient presents with  . Follow-up  Dyspnea:  Not limited by breathing from desired activities  / work out s sob /  Sometimes makes her cough/ not reproducible Cough: no change whatsoever sense of pnds year round but worse in spring/fall  Sleep: ok p takes the chlortab x 4 mg and then too sleepy - still coughs and "wheezes" immediatetly at hs  SABA use:  N/a   Still has some overt hb despite gerd rx/ on bcp's for mood swings  No obvious day to day or daytime variability or assoc excess/ purulent sputum or mucus plugs or hemoptysis or cp or chest tightness, subjective wheeze or overt sinus or hb symptoms. No unusual exposure hx or h/o childhood pna/ asthma or knowledge of premature birth.  Sleeping ok flat without nocturnal  or early am exacerbation  of respiratory  c/o's or need for noct saba. Also denies any obvious fluctuation of symptoms with weather or environmental changes or other aggravating or alleviating factors except as outlined above   Current Allergies, Complete Past Medical History, Past Surgical History, Family History, and Social History were reviewed in [employer] record.  ROS  The following are not active complaints unless bolded Hoarseness, sore throat, dysphagia, dental problems, itching, sneezing,  nasal congestion or  discharge of excess mucus or purulent secretions, ear ache,   fever, chills, sweats, unintended wt loss or wt gain, classically pleuritic or exertional cp,  orthopnea pnd or leg swelling, presyncope, palpitations, abdominal pain, anorexia, nausea, vomiting, diarrhea  or change in bowel habits or change in bladder habits, change in stools or change in urine, dysuria, hematuria,  rash, arthralgias, visual complaints, headache, numbness, weakness or ataxia or problems with walking or coordination,  change in mood/affect or memory.        Current Meds  Medication Sig  . famotidine (PEPCID) 20 MG tablet One at supper  . LO LOESTRIN FE 1 MG-10 MCG / 10 MCG tablet TAKE ONE TABLET BY MOUTH ONCE DAILY  . pantoprazole (PROTONIX) 40 MG tablet Take 1 tablet (40 mg total) by mouth daily. Take 30-60 min before first meal of the day  . predniSONE (DELTASONE) 10 MG tablet Take  4 each am x 2 days,   2 each am x 2 days,  1 each am x 2 days and stop                Objective:   Physical Exam   amb wf nad/ says freq urge to swallow pnds but no longer throat clearing (actively trying not to)     09/12/2017         143   08/09/17 141 lb (64 kg)  07/07/17 139 lb (63 kg)  07/06/17 139 lb (63 kg)      Vital signs reviewed - Note on arrival 02 sats  99% on RA       HEENT: nl dentition,   and oropharynx which is pristine. Nl external ear canals without cough reflex - mild  bilateral non-specific turbinate edema     NECK :  without JVD/Nodes/TM/ nl carotid upstrokes bilaterally   LUNGS: no acc muscle use,  Nl contour chest which is clear to A and P bilaterally without cough on insp or exp maneuvers   CV:  RRR  no s3 or murmur or increase in P2, and no edema   ABD:  soft and nontender with nl inspiratory excursion  in the supine position. No bruits or organomegaly appreciated, bowel sounds nl  MS:  Nl gait/ ext warm without deformities, calf tenderness, cyanosis or clubbing No obvious joint  restrictions   SKIN: warm and dry without lesions    NEURO:  alert, approp, nl sensorium with  no motor or cerebellar deficits apparent.       CXR PA and Lateral:   09/12/2017 :    I personally reviewed images and agree with radiology impression as follows:    wnl   Labs ordered 09/12/2017  Allergy profile    Assessment:

## 2017-09-13 ENCOUNTER — Telehealth: Payer: Self-pay | Admitting: Internal Medicine

## 2017-09-13 LAB — RESPIRATORY ALLERGY PROFILE REGION II ~~LOC~~
Allergen, A. alternata, m6: <0.1 kU/L
Allergen, Comm Silver Birch, t9: <0.1 kU/L
Allergen, D pternoyssinus,d7: <0.1 kU/L
Allergen, Mouse Urine Protein, e78: <0.1 kU/L
Allergen, Oak,t7: <0.1 kU/L
Box Elder IgE: <0.1 kU/L
CLADOSPORIUM HERBARUM (M2) IGE: <0.1 kU/L
CLASS: 0
CLASS: 0
CLASS: 0
CLASS: 0
CLASS: 0
CLASS: 0
CLASS: 0
CLASS: 0
CLASS: 0
CLASS: 0
CLASS: 0
COMMON RAGWEED (SHORT) (W1) IGE: <0.1 kU/L
Class: 0
Class: 0
Class: 0
Class: 0
Class: 0
Class: 0
Class: 0
Class: 0
Class: 0
Class: 0
Class: 0
Class: 0
Class: 0
Cockroach: <0.1 kU/L
D. farinae: <0.1 kU/L
Elm IgE: <0.1 kU/L
IgE (Immunoglobulin E), Serum: 4 kU/L (ref <=114)
Johnson Grass: <0.1 kU/L
Pecan/Hickory Tree IgE: <0.1 kU/L
Timothy Grass: <0.1 kU/L

## 2017-09-13 LAB — INTERPRETATION:

## 2017-09-13 NOTE — Telephone Encounter (Signed)
Ok one puff of astelin and one puff of flonase each nostril bid

## 2017-09-13 NOTE — Telephone Encounter (Signed)
PA request received for Dymista through [email or web address] Key: [facility] PA request states that this has already been rejected prior to initiating pa..  MW please advise if you would like to change Dymista to Astelin/Flonase, or any other recs.  Thanks!

## 2017-09-14 MED ORDER — AZELASTINE HCL 0.1 % NA SOLN: 1.0000 | Freq: Two times a day (BID) | NASAL | 11 refills | Status: DC

## 2017-09-14 MED ORDER — FLUTICASONE PROPIONATE 50 MCG/ACT NA SUSP: 1.0000 | Freq: Two times a day (BID) | NASAL | 11 refills | Status: DC

## 2017-09-14 NOTE — Telephone Encounter (Signed)
Rxs have been sent in per Dr. Melvyn Novas. I have left a message with the pt to return our call to let her know about the medication change.

## 2017-09-14 NOTE — Telephone Encounter (Signed)
Spoke with pt. She is aware of this medication change. Nothing further was needed.

## 2017-09-26 ENCOUNTER — Encounter: Payer: Self-pay | Admitting: Adult Health

## 2017-09-26 ENCOUNTER — Ambulatory Visit (INDEPENDENT_AMBULATORY_CARE_PROVIDER_SITE_OTHER): Payer: 59 | Admitting: Adult Health

## 2017-09-26 ENCOUNTER — Other Ambulatory Visit (HOSPITAL_COMMUNITY)
Admission: RE | Admit: 2017-09-26 | Discharge: 2017-09-26 | Disposition: A | Discharge status: Home / Self Care | Payer: 59 | Source: Ambulatory Visit | Attending: Adult Health | Admitting: Adult Health

## 2017-09-26 VITALS — BP 110/72 | HR 78 | Ht 63.25 in | Wt 139.5 lb

## 2017-09-26 DIAGNOSIS — Z01419 Encounter for gynecological examination (general) (routine) without abnormal findings: Secondary | ICD-10-CM

## 2017-09-26 DIAGNOSIS — Z01411 Encounter for gynecological examination (general) (routine) with abnormal findings: Secondary | ICD-10-CM

## 2017-09-26 DIAGNOSIS — Z1211 Encounter for screening for malignant neoplasm of colon: Secondary | ICD-10-CM | POA: Insufficient documentation

## 2017-09-26 DIAGNOSIS — Z7689 Persons encountering health services in other specified circumstances: Secondary | ICD-10-CM | POA: Diagnosis not present

## 2017-09-26 DIAGNOSIS — E049 Nontoxic goiter, unspecified: Secondary | ICD-10-CM

## 2017-09-26 DIAGNOSIS — Z1212 Encounter for screening for malignant neoplasm of rectum: Secondary | ICD-10-CM | POA: Diagnosis not present

## 2017-09-26 LAB — HEMOCCULT GUIAC POC 1CARD (OFFICE): FECAL OCCULT BLD: NEGATIVE

## 2017-09-26 NOTE — Progress Notes (Signed)
Patient ID: Kristi Medina, female   DOB: 04/13/1976, 42 y.o.   MRN: [id number] History of Present Illness:  Kristi Medina is a 42 year old white female, divorced, G2P2 in for a well woman gyn exam and pap. PCP is Dr Mickie Hillier, and sees Dr Loanne Drilling for thyroid.   Current Medications, Allergies, Past Medical History, Past Surgical History, Family History and Social History were reviewed in [employer] record.     Review of Systems: Patient denies any headaches, hearing loss, fatigue, blurred vision, shortness of breath, chest pain, abdominal pain, problems with bowel movements, urination, or intercourse. No joint pain or mood swings. +drainage with wheeze if laying flat, has see pulmonary doctor and has negative W/U.She is on OCs for period control after failed ablation and they help with her moods.     Physical Exam:BP 110/72 (BP Location: Left Arm, Patient Position: Sitting, Cuff Size: Normal)   Pulse 78   Ht 5' 3.25" (1.607 m)   Wt 139 lb 8 oz (63.3 kg)   BMI 24.52 kg/m  General:  Well developed, well nourished, no acute distress Skin:  Warm and dry Neck:  Midline trachea, enlarged thyroid,L>R, good ROM, no lymphadenopathy Lungs; Clear to auscultation bilaterally Breast:  No dominant palpable mass, retraction, or nipple discharge Cardiovascular: Regular rate and rhythm Abdomen:  Soft, non tender, no hepatosplenomegaly Pelvic:  External genitalia is normal in appearance, no lesions.  The vagina is normal in appearance. Urethra has no lesions or masses. The cervix is bulbous. Pap with HPV and GC/CHL performed. Uterus is felt to be normal size, shape, and contour.  No adnexal masses or tenderness noted.Bladder is non tender, no masses felt. Rectal: Good sphincter tone, no polyps, or hemorrhoids felt.  Hemoccult negative. Extremities/musculoskeletal:  No swelling or varicosities noted, no clubbing or cyanosis Psych:  No mood changes, alert and cooperative,seems happy PHQ  2 score 0  Impression: 1. Encounter for gynecological examination with Papanicolaou smear of cervix   2. Enlarged thyroid   3. Encounter for menstrual regulation   4. Screening for colorectal cancer       Plan: Continue lo loestrin daily, 5 packs given, she does not take white or brown tablets Physical in 1 year Pap in 3 if normal Mammogram yearly  Labs with PCP

## 2017-09-29 LAB — CYTOLOGY - PAP
Chlamydia: NEGATIVE
DIAGNOSIS: NEGATIVE
HPV: NOT DETECTED
Neisseria Gonorrhea: NEGATIVE

## 2017-10-10 ENCOUNTER — Encounter: Payer: Self-pay | Admitting: Internal Medicine

## 2017-10-10 ENCOUNTER — Ambulatory Visit: Payer: 59 | Admitting: Internal Medicine

## 2017-10-10 VITALS — BP 112/74 | HR 76 | Ht 63.25 in | Wt 138.8 lb

## 2017-10-10 DIAGNOSIS — J45991 Cough variant asthma: Secondary | ICD-10-CM | POA: Diagnosis not present

## 2017-10-10 NOTE — Patient Instructions (Signed)
No change on reflux medications  Stop flonase and astelin   >  dysmista one puff each nostril twice daily  - if not effective you need referral to [facility] Dr Carol Ada

## 2017-10-10 NOTE — Progress Notes (Signed)
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 Asthma.     08/09/2017 1st [facility] office visit/ Kristi Medina   Chief Complaint  Patient presents with  . Pulmonary Consult    Referred by Kristi. Renato Medina. She c/o wheezing when she lies down for the past 6-9 months.   worst symptoms of pnds are in the fall but really year round x 10 y prior to OV   Does kick boxing ok s sob or cough related to ex   Kristi Medina v Neurogenic Cough Differentiator Medina Comments  Do you awaken from a sound sleep coughing violently?                            With trouble breathing? Never no   Do you have choking episodes when you cannot  Get enough air, gasping for air ?              no   Do you usually cough when you lie down into  The bed, or when you just lie down to rest ?                          Wheeze but no urge cough   Do you usually cough after meals or eating?         Yes    Do you cough when (or after) you bend over?    no   GERD SCORE     Kristi Medina v Neurogenic Cough Differentiator Neurogenic   Do you more-or-less cough all day long? Sporadic throat clearing   Does change of temperature make you cough? no   Does laughing or chuckling cause you to cough? yes   Do fumes (perfume, automobile fumes, burned  Toast, etc.,) cause you to cough ?      no   Does speaking, singing, or talking on the phone cause you to cough   ?               No    Neurogenic/Airway score     rec Stop singulair  You may need to seriously consider an alternative to  loestrin that minimizes if possible your exposure to progestrerone GERD diet   Pantoprazole (protonix) 40 mg   Take  30-60 min before first meal of the day and Pepcid (famotidine)  20 mg right after supper  bedtime until return to office - this is the best way to tell whether stomach acid is contributing to your problem.   For drainage / throat tickle try take CHLORPHENIRAMINE  4 mg - take one every 4 hours as needed   Prednisone 10 mg take  4 each am x 2 days,   2 each am x 2 days,  1 each am x 2 days and stop (take with bfast)    09/12/2017  f/u ov/Kristi Medina re:  Cough x 10 years no  change / no worse off singulair Chief Complaint  Patient presents with  . Follow-up  Dyspnea:  Not limited by breathing from desired activities  / work out s sob /  Sometimes makes her cough/ not reproducible Cough: no change whatsoever sense of pnds year round but worse in spring/fall  Sleep: ok p takes the chlortab x 4 mg and then too sleepy - still coughs and "wheezes" immediatetly at hs  SABA use:  N/a   Still has some overt hb despite gerd rx/ on bcp's for mood swings rec Try taking chlorpheniramine an hour or two before bed to see if still sleepy when wake up and if not ok to take 2 Try dymista one twice daily each nostril - point to ear on same side Don't change the Medina meds yet but discuss options for getting off your progesterone with your gyn doc Please remember to go to the lab and x-ray department downstairs in the basement  for your tests - we will call you with the results when they are available.   -add  consider sinus ct then refer to Kristi Medina vs Kristi Medina trial next    10/10/2017  f/u ov/Kristi Medina re:  Cough x 10 y/ could not afford dymista - chlorpheniramine works but too sedating  Chief Complaint  Patient presents with  . Follow-up    PND is unchanged. No new co's.    Dyspnea:  Kick bpxing s sob Cough: pnds day >> noct  Sleep: fine p h1 hs most nocts but still sensatoin of globus SABA use:   None   Saw gyn, on lowest dose bcp's, considering stopping them completely but not ready yet   No obvious day to day or daytime variability or assoc excess/ purulent sputum or mucus plugs or hemoptysis or cp or chest tightness, subjective wheeze or overt sinus or hb symptoms. No unusual exposure hx or h/o childhood pna/ asthma or knowledge of premature birth.  Sleeping  Fine p h1 hs   without nocturnal  or early am exacerbation  of respiratory  c/o's or need for noct saba. Also denies any obvious fluctuation of symptoms with weather or environmental changes or other aggravating or alleviating factors except as outlined above   Current Allergies, Complete Past Medical History, Past Surgical History, Family History, and Social History were reviewed in [employer] record.  ROS  The following are not active complaints unless bolded Hoarseness, sore throat, dysphagia, dental problems, itching, sneezing,  nasal congestion or discharge of excess mucus or purulent secretions, ear ache,   fever, chills, sweats, unintended wt loss or wt gain, classically pleuritic or exertional cp,  orthopnea pnd or arm/hand swelling  or leg swelling, presyncope, palpitations, abdominal pain, anorexia, nausea, vomiting, diarrhea  or change in bowel habits or change in bladder habits, change in stools or change in urine, dysuria, hematuria,  rash, arthralgias, visual complaints, headache, numbness, weakness or ataxia or problems with walking or coordination,  change in mood or  memory.        Current Meds  Medication Sig  . famotidine (PEPCID) 20 MG tablet One at supper  . LO LOESTRIN FE 1 MG-10 MCG / 10 MCG tablet TAKE ONE TABLET BY MOUTH ONCE DAILY  . pantoprazole (PROTONIX) 40 MG tablet Take 1 tablet (40 mg total) by mouth daily. Take 30-60 min before first meal of the day  .  ] azelastine (ASTELIN) 0.1 % nasal spray Place 1 spray into both nostrils 2 (two) times daily.  . [ ]   fluticasone (FLONASE)  50 MCG/ACT nasal spray Place 1 spray into both nostrils 2 (two) times daily.                      Objective:   Physical Exam  amb wf nad/ no throat clearing today   10/10/2017            138   09/12/2017         143   08/09/17 141 lb (64 kg)  07/07/17 139 lb (63 kg)  07/06/17 139 lb (63 kg)      Vital signs reviewed - Note on arrival 02 sats  99% on RA        HEENT: nl dentition, turbinates bilaterally, and oropharynx. Nl external ear canals without cough reflex   NECK :  without JVD/Nodes/TM/ nl carotid upstrokes bilaterally   LUNGS: no acc muscle use,  Nl contour chest which is clear to A and P bilaterally without cough on insp or exp maneuvers   CV:  RRR  no s3 or murmur or increase in P2, and no edema   ABD:  soft and nontender with nl inspiratory excursion in the supine position. No bruits or organomegaly appreciated, bowel sounds nl  MS:  Nl gait/ ext warm without deformities, calf tenderness, cyanosis or clubbing No obvious joint restrictions   SKIN: warm and dry without lesions    NEURO:  alert, approp, nl sensorium with  no motor or cerebellar deficits apparent.           Assessment:

## 2017-10-11 ENCOUNTER — Encounter: Payer: Self-pay | Admitting: Internal Medicine

## 2017-10-11 NOTE — Assessment & Plan Note (Addendum)
-   Spirometry 07/07/2017  Flattening of effort dep portion of f/v loop (done in supine position to reproduce wheezing in that position)  - Spirometry 08/09/2017   wnl s curvature FENO 08/09/2017  =   12 on singulair with no improvement in symptoms so d/c'd FENO 09/12/2017  =    16 off singulair  > no worse  - Allergy profile 09/12/2017 >  Eos 0.1 /  IgE  4 RAST neg - dymista trial 09/12/2017 >>> denied by insurance 09/13/2017 > try astelin/flonase one bid> not effective - 10/10/2017 dymista samples given and if not effective rec refer to [facility]/ voice center   Noct better s pnds/ "wheeze" p taking 1st gen H1 blockers per guidelines  But can't tol daytime effects and continues with sense of pnds/ globus with little evidence that this is the case so rec dymista samples then refer to Dr Joya Gaskins (vs trial of gabapentin but she's convinced the problem is excess drainage / something stuck in her throat so probably makes more sense to see Joya Gaskins first) and continue max gerd rx to prevent cyclical cough > ok to stop once the cough is gone for at least a month and keep in mind any form of extra progesterone can play a role in LES dysfunction and prefer off it in this setting.  I had an extended discussion with the patient reviewing all relevant studies completed to date and  lasting 15 to 20 minutes of a 25 minute visit    Each maintenance medication was reviewed in detail including most importantly the difference between maintenance and prns and under what circumstances the prns are to be triggered using an action plan format that is not reflected in the computer generated alphabetically organized AVS.    Please see AVS for specific instructions unique to this visit that I personally wrote and verbalized to the the pt in detail and then reviewed with pt  by my nurse highlighting any  changes in therapy recommended at today's visit to their plan of care.

## 2018-06-23 ENCOUNTER — Other Ambulatory Visit: Payer: Self-pay | Admitting: Family Medicine

## 2018-06-23 DIAGNOSIS — Z1231 Encounter for screening mammogram for malignant neoplasm of breast: Secondary | ICD-10-CM

## 2018-07-06 ENCOUNTER — Ambulatory Visit
Admission: RE | Admit: 2018-07-06 | Discharge: 2018-07-06 | Disposition: A | Discharge status: Home / Self Care | Payer: 59 | Source: Ambulatory Visit

## 2018-07-06 DIAGNOSIS — Z1231 Encounter for screening mammogram for malignant neoplasm of breast: Secondary | ICD-10-CM

## 2018-11-08 ENCOUNTER — Telehealth: Payer: Self-pay | Admitting: Family Medicine

## 2018-11-08 NOTE — Telephone Encounter (Signed)
Ok for her partic situation I can order this make sure we get the results if done at her work

## 2018-11-08 NOTE — Telephone Encounter (Signed)
Discussed with pt's mother. And orders faxed to fax number given by mother.

## 2018-11-08 NOTE — Telephone Encounter (Signed)
She states she is not having any trouble breathing, No fever, she does have a headache and says it can be pretty painful. She has had migraines in the past around 20 years ago.This headache started last Thursday and has been constant everyday. 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 She does not want any medication at this time for the headache.

## 2018-11-08 NOTE — Telephone Encounter (Signed)
Hold message til we get covid 19 test results. Pt will be taking order to urgent care in [place] to get done.

## 2018-11-08 NOTE — Telephone Encounter (Signed)
Patient states she has had headache and congestion for a week now and would like to get order to be tested for covid 19 .She states can get it done at her job. Fax order to [phone]

## 2018-11-13 NOTE — Telephone Encounter (Signed)
rec face to face o v this wk

## 2018-11-13 NOTE — Telephone Encounter (Signed)
Pt contacted and informed that provider recommends a face to face ov this week. Pt transferred up front to set up appt.

## 2018-11-13 NOTE — Telephone Encounter (Signed)
Mother states her covid 38 test was negative as well as her daughters. bp 155/99 last Wednesday and has been having headaches on back of her head right side and sometimes goes to the top and feels like a band squeezing her head. Has had some dizziness and nausea. Symptoms started may 28th. Headache every day. bp has 129/93, 148/92, 138/99. Takes excedrin and the headache goes away and bp goes back to normal.   [facility] [address].

## 2018-11-16 ENCOUNTER — Encounter: Payer: Self-pay | Admitting: Family Medicine

## 2018-11-16 ENCOUNTER — Ambulatory Visit (INDEPENDENT_AMBULATORY_CARE_PROVIDER_SITE_OTHER): Payer: 59 | Admitting: Family Medicine

## 2018-11-16 DIAGNOSIS — R519 Headache, unspecified: Secondary | ICD-10-CM

## 2018-11-16 DIAGNOSIS — R51 Headache: Secondary | ICD-10-CM | POA: Diagnosis not present

## 2018-11-16 DIAGNOSIS — R030 Elevated blood-pressure reading, without diagnosis of hypertension: Secondary | ICD-10-CM

## 2018-11-16 MED ORDER — TOPIRAMATE 25 MG PO TABS: ORAL_TABLET | ORAL | 0 refills | Status: DC

## 2018-11-16 NOTE — Progress Notes (Signed)
   Subjective:    Patient ID: Kristi Medina, female    DOB: Mar 28, 1976, 43 y.o.   MRN: [id number] Audio plus visual HPIelevated bp. The highest pt has gotten was 155/99. bp higher in the afternoons.  Headaches since may 28th. Back lower right side and top center.prett rough h , felt pretty rough  loer right post , no post head  Ok in the morns worse in the afternoon  Has tendency towards headaches   Throbbing and pounding  Some nausea    h a comes and g Headaches every day. Takes excedrin and it helps but never completely goes away.   Virtual Visit via Video Note  I connected with Kristi Medina on 11/16/18 at  8:40 AM EDT by a video enabled telemedicine application and verified that I am speaking with the correct person using two identifiers.  Location: Patient: home Provider: office   I discussed the limitations of evaluation and management by telemedicine and the availability of in person appointments. The patient expressed understanding and agreed to proceed.  History of Present Illness:    Observations/Objective:   Assessment and Plan:   Follow Up Instructions:    I discussed the assessment and treatment plan with the patient. The patient was provided an opportunity to ask questions and all were answered. The patient agreed with the plan and demonstrated an understanding of the instructions.   The patient was advised to call back or seek an in-person evaluation if the symptoms worsen or if the condition fails to improve as anticipated.  I provided 25 minutes of non-face-to-face time during this encounter.    Of note patient has had migraines in the past.  Generally milder.  This does have a throbbing component.  Also substantial nausea.  Blood pressure has been up at times but then comes down at times.  Responding some to Excedrin.  Does have some nausea.    Review of Systems No headache, no major weight loss or weight gain, no chest pain no back pain  abdominal pain no change in bowel habits complete ROS otherwise negative     Objective:   Physical Exam  Virtual      Assessment & Plan:  Impression probable protracted migraine headache discussed rationale discussed initiate Topamax rationale discussed add 2 Aleve twice a day  2.  Elevated blood pressure not enough to initiate meds rationale discussed  Recheck in several weeks in the office

## 2018-12-05 ENCOUNTER — Other Ambulatory Visit: Payer: Self-pay

## 2018-12-06 ENCOUNTER — Encounter: Payer: Self-pay | Admitting: Family Medicine

## 2018-12-06 ENCOUNTER — Ambulatory Visit: Payer: 59 | Admitting: Family Medicine

## 2018-12-06 ENCOUNTER — Other Ambulatory Visit: Payer: Self-pay | Admitting: Adult Health

## 2018-12-06 VITALS — BP 120/90 | Temp 98.5°F | Wt 142.6 lb

## 2018-12-06 DIAGNOSIS — R5383 Other fatigue: Secondary | ICD-10-CM

## 2018-12-06 DIAGNOSIS — R519 Headache, unspecified: Secondary | ICD-10-CM

## 2018-12-06 DIAGNOSIS — Z566 Other physical and mental strain related to work: Secondary | ICD-10-CM

## 2018-12-06 DIAGNOSIS — I10 Essential (primary) hypertension: Secondary | ICD-10-CM

## 2018-12-06 DIAGNOSIS — R030 Elevated blood-pressure reading, without diagnosis of hypertension: Secondary | ICD-10-CM | POA: Diagnosis not present

## 2018-12-06 DIAGNOSIS — R51 Headache: Secondary | ICD-10-CM | POA: Diagnosis not present

## 2018-12-06 MED ORDER — VALSARTAN 80 MG PO TABS: 80.0000 mg | ORAL_TABLET | Freq: Every day | ORAL | 2 refills | Status: DC

## 2018-12-06 MED ORDER — LO LOESTRIN FE 1 MG-10 MCG / 10 MCG PO TABS: 1.0000 | ORAL_TABLET | Freq: Every day | ORAL | 6 refills | Status: DC

## 2018-12-06 NOTE — Progress Notes (Signed)
   Subjective:    Patient ID: Kristi Medina, female    DOB: 1975/08/24, 43 y.o.   MRN: [id number] Patient arrives office with numerous issues HPI Daily for 6 weeks now has had a progressive headache.  Severe in nature.  Primarily posterior.  Sometimes wakes up with it in the middle the night.  Worse with cough or sneezing.  Generally in the same location.  Minimal radiation.  Very painful.  Patient is worried about it.  Always has it despite medications.  History of migraines.  We have started Topamax in hopes of helping.  Really has not.  Patient states feels definitely different than migraines.  Strong family history of high blood pressure.  Patient watching salt intake.  Not exercising much.  Brings in numerous blood pressure readings.  All very elevated.  Reports considerable stress.  Worsening and worsening.  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  States they are basically overwhelmed.  Having trouble sleeping.  Anxious nervous.  Worried about it.  No suicidal homicidal thoughts.   Patient arrives to discuss ongoing headaches and elevation of blood pressure. Patient states it all started with a bad headache 11/02/2018- has been tested for covid but the test came back negative.  Review of Systems No headache, no major weight loss or weight gain, no chest pain no back pain abdominal pain no change in bowel habits complete ROS otherwise negative     Objective:   Physical Exam  Alert and oriented, vitals reviewed and stable, NAD ENT-TM's and ext canals WNL bilat via otoscopic exam Soft palate, tonsils and post pharynx WNL via oropharyngeal exam Neck-symmetric, no masses; thyroid nonpalpable and nontender Pulmonary-no tachypnea or accessory muscle use; Clear without wheezes via auscultation Card--no abnrml murmurs, rhythm reg and rate WNL Carotid pulses symmetric, without bruits       Assessment & Plan:  Impression #1 severe worsening  headache with multiple red flag signs.  Waking up at night with it.  Worse with coughing or sneezing.  Time to press on and do MRI imaging of brain.  Rationale discussed.  2.  Essential hypertension.  Discussed at length.  Options discussed.  Time for medication will initiate.  3.  Increased stress.  Considerable.  Adding to all of this.  We will go ahead and write off work due to symptoms hands rationale discussed.  4.  History migraine headaches.  Discussed.  These headaches seem definitely different  5.  History of thyroid disease.  Hashimoto's disease.  Thyroid has not been assessed recently.  Notes considerable fatigue.  We will also do other blood tests  Greater than 50% of this 40 minute face to face visit was spent in counseling and discussion and coordination of care regarding the above diagnosis/diagnosies   Work excuse written.  Follow-up as noted.  Further recommendations based on results.

## 2018-12-06 NOTE — Progress Notes (Signed)
Refilled lo lo

## 2018-12-07 LAB — HEPATIC FUNCTION PANEL
ALT: 18 IU/L (ref 0–32)
AST: 19 IU/L (ref 0–40)
Albumin: 4.4 g/dL (ref 3.8–4.8)
Alkaline Phosphatase: 49 IU/L (ref 39–117)
Bilirubin Total: 0.2 mg/dL (ref 0.0–1.2)
Bilirubin, Direct: 0.08 mg/dL (ref 0.00–0.40)
Total Protein: 7 g/dL (ref 6.0–8.5)

## 2018-12-07 LAB — CBC WITH DIFFERENTIAL/PLATELET
Basophils Absolute: 0 10*3/uL (ref 0.0–0.2)
Basos: 0 %
EOS (ABSOLUTE): 0.1 10*3/uL (ref 0.0–0.4)
Eos: 1 %
Hematocrit: 40.2 % (ref 34.0–46.6)
Hemoglobin: 13.3 g/dL (ref 11.1–15.9)
Immature Grans (Abs): 0 10*3/uL (ref 0.0–0.1)
Immature Granulocytes: 0 %
Lymphocytes Absolute: 2.4 10*3/uL (ref 0.7–3.1)
Lymphs: 28 %
MCH: 29.3 pg (ref 26.6–33.0)
MCHC: 33.1 g/dL (ref 31.5–35.7)
MCV: 89 fL (ref 79–97)
Monocytes Absolute: 0.5 10*3/uL (ref 0.1–0.9)
Monocytes: 6 %
Neutrophils Absolute: 5.5 10*3/uL (ref 1.4–7.0)
Neutrophils: 65 %
Platelets: 259 10*3/uL (ref 150–450)
RBC: 4.54 x10E6/uL (ref 3.77–5.28)
RDW: 12 % (ref 11.7–15.4)
WBC: 8.5 10*3/uL (ref 3.4–10.8)

## 2018-12-07 LAB — BASIC METABOLIC PANEL
BUN/Creatinine Ratio: 11 (ref 9–23)
BUN: 15 mg/dL (ref 6–24)
CO2: 20 mmol/L (ref 20–29)
Calcium: 9.3 mg/dL (ref 8.7–10.2)
Chloride: 105 mmol/L (ref 96–106)
Creatinine, Ser: 1.38 mg/dL — ABNORMAL HIGH (ref 0.57–1.00)
GFR calc Af Amer: 54 mL/min/{1.73_m2} — ABNORMAL LOW (ref >59)
GFR calc non Af Amer: 47 mL/min/{1.73_m2} — ABNORMAL LOW (ref >59)
Glucose: 82 mg/dL (ref 65–99)
Potassium: 3.8 mmol/L (ref 3.5–5.2)
Sodium: 139 mmol/L (ref 134–144)

## 2018-12-07 LAB — TSH: TSH: 1.1 u[IU]/mL (ref 0.450–4.500)

## 2018-12-07 LAB — T4, FREE: Free T4: 1.37 ng/dL (ref 0.82–1.77)

## 2018-12-07 NOTE — Addendum Note (Signed)
Addended by: Vicente Males on: 12/07/2018 11:33 AM   Modules accepted: Orders

## 2018-12-12 ENCOUNTER — Telehealth: Payer: Self-pay | Admitting: Family Medicine

## 2018-12-12 DIAGNOSIS — Z029 Encounter for administrative examinations, unspecified: Secondary | ICD-10-CM

## 2018-12-12 NOTE — Telephone Encounter (Signed)
Patient had FMLA faxed over to be completed. I filled in what I could please review and fill-in highlighted areas, date and sign. In your box. She needing it faxed in by 7/17

## 2018-12-14 ENCOUNTER — Ambulatory Visit (HOSPITAL_COMMUNITY)
Admission: RE | Admit: 2018-12-14 | Discharge: 2018-12-14 | Disposition: A | Discharge status: Home / Self Care | Payer: 59 | Source: Ambulatory Visit | Attending: Family Medicine | Admitting: Family Medicine

## 2018-12-14 ENCOUNTER — Other Ambulatory Visit: Payer: Self-pay

## 2018-12-14 DIAGNOSIS — R51 Headache: Secondary | ICD-10-CM | POA: Insufficient documentation

## 2018-12-20 NOTE — Telephone Encounter (Signed)
Patient call to check on FMLA form that was sent back on 7/7 and she is needing it faxed in by 7/17. Please advise

## 2018-12-21 NOTE — Telephone Encounter (Signed)
FMLA has done faxed and copy up front for patient.

## 2018-12-26 ENCOUNTER — Telehealth: Payer: Self-pay | Admitting: Family Medicine

## 2018-12-26 NOTE — Telephone Encounter (Signed)
Please advise. Thank you

## 2018-12-26 NOTE — Telephone Encounter (Signed)
Patient faxed over return to work form needing your signature and date and faxed back today if possible. In your box.

## 2019-02-24 LAB — BASIC METABOLIC PANEL
BUN/Creatinine Ratio: 12 (ref 9–23)
BUN: 13 mg/dL (ref 6–24)
CO2: 24 mmol/L (ref 20–29)
Calcium: 9.7 mg/dL (ref 8.7–10.2)
Chloride: 103 mmol/L (ref 96–106)
Creatinine, Ser: 1.07 mg/dL — ABNORMAL HIGH (ref 0.57–1.00)
GFR calc Af Amer: 74 mL/min/{1.73_m2} (ref >59)
GFR calc non Af Amer: 64 mL/min/{1.73_m2} (ref >59)
Glucose: 78 mg/dL (ref 65–99)
Potassium: 4.3 mmol/L (ref 3.5–5.2)
Sodium: 141 mmol/L (ref 134–144)

## 2019-03-04 ENCOUNTER — Other Ambulatory Visit: Payer: Self-pay | Admitting: Family Medicine

## 2019-03-07 ENCOUNTER — Other Ambulatory Visit: Payer: Self-pay

## 2019-03-07 ENCOUNTER — Encounter: Payer: Self-pay | Admitting: Family Medicine

## 2019-03-07 ENCOUNTER — Ambulatory Visit (INDEPENDENT_AMBULATORY_CARE_PROVIDER_SITE_OTHER): Payer: 59 | Admitting: Family Medicine

## 2019-03-07 VITALS — BP 122/78 | Ht 63.0 in

## 2019-03-07 DIAGNOSIS — I10 Essential (primary) hypertension: Secondary | ICD-10-CM

## 2019-03-07 MED ORDER — VALSARTAN 80 MG PO TABS: 80.0000 mg | ORAL_TABLET | Freq: Every day | ORAL | 5 refills | Status: DC

## 2019-03-07 NOTE — Progress Notes (Signed)
   Subjective:    Patient ID: Kristi Medina, female    DOB: Feb 20, 1976, 43 y.o.   MRN: [id number]  HPI Patient arrives for a follow up on blood pressure.  Blood pressure medicine and blood pressure levels reviewed today with patient. Compliant with blood pressure medicine. States does not miss a dose. No obvious side effects. Blood pressure generally good when checked elsewhere. Watching salt intake.   Overall headaches have improved considerably.  Stress level still substantial at work.  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 Blood pressure good on repeat. HEENT normal. Lungs clear. Heart regular rate and rhythm.       Assessment & Plan:  Impression essential hypertension.  Discussed.  Blood pressure good when checked elsewhere.  Continue same dose of meds diet exercise discussed medications refilled follow-up in 6 months.  Flu shot already given.

## 2019-03-30 ENCOUNTER — Telehealth: Payer: Self-pay | Admitting: Adult Health

## 2019-03-30 MED ORDER — HYDROCORT-PRAMOXINE (PERIANAL) 1-1 % EX FOAM: 1.0000 | Freq: Three times a day (TID) | CUTANEOUS | 2 refills | Status: DC

## 2019-03-30 NOTE — Telephone Encounter (Signed)
Has hemorrhoid, will rx proctofoam HC

## 2019-05-27 ENCOUNTER — Other Ambulatory Visit: Payer: Self-pay | Admitting: Adult Health

## 2019-06-18 ENCOUNTER — Ambulatory Visit (INDEPENDENT_AMBULATORY_CARE_PROVIDER_SITE_OTHER): Payer: 59 | Admitting: Adult Health

## 2019-06-18 ENCOUNTER — Encounter: Payer: Self-pay | Admitting: Adult Health

## 2019-06-18 ENCOUNTER — Other Ambulatory Visit (HOSPITAL_COMMUNITY)
Admission: RE | Admit: 2019-06-18 | Discharge: 2019-06-18 | Disposition: A | Discharge status: Home / Self Care | Payer: 59 | Source: Ambulatory Visit | Attending: Adult Health | Admitting: Adult Health

## 2019-06-18 ENCOUNTER — Other Ambulatory Visit: Payer: Self-pay

## 2019-06-18 VITALS — BP 124/78 | HR 83 | Ht 63.75 in | Wt 150.0 lb

## 2019-06-18 DIAGNOSIS — Z7689 Persons encountering health services in other specified circumstances: Secondary | ICD-10-CM | POA: Diagnosis not present

## 2019-06-18 DIAGNOSIS — Z1212 Encounter for screening for malignant neoplasm of rectum: Secondary | ICD-10-CM | POA: Diagnosis not present

## 2019-06-18 DIAGNOSIS — Z01419 Encounter for gynecological examination (general) (routine) without abnormal findings: Secondary | ICD-10-CM | POA: Insufficient documentation

## 2019-06-18 DIAGNOSIS — E049 Nontoxic goiter, unspecified: Secondary | ICD-10-CM | POA: Diagnosis not present

## 2019-06-18 DIAGNOSIS — Z1211 Encounter for screening for malignant neoplasm of colon: Secondary | ICD-10-CM

## 2019-06-18 DIAGNOSIS — R002 Palpitations: Secondary | ICD-10-CM

## 2019-06-18 LAB — HEMOCCULT GUIAC POC 1CARD (OFFICE): Fecal Occult Blood, POC: NEGATIVE

## 2019-06-18 MED ORDER — POLYETHYLENE GLYCOL 3350 17 G PO PACK: 17.0000 g | PACK | Freq: Every day | ORAL | 99 refills | Status: DC

## 2019-06-18 MED ORDER — POLYETHYLENE GLYCOL 3350 17 GM/SCOOP PO POWD: 1.0000 | Freq: Once | ORAL | 0 refills | Status: DC

## 2019-06-18 NOTE — Addendum Note (Signed)
Addended by: Diona Fanti A on: 06/18/2019 10:50 AM   Modules accepted: Orders

## 2019-06-18 NOTE — Progress Notes (Signed)
Patient ID: Kristi Medina, female   DOB: 06-09-1975, 44 y.o.   MRN: [id number] History of Present Illness:  Kristi Medina is a 44 year old white female, divorced, G2P2 in for a well woman gyn exam and pap. PCP is Mickie Hillier.  Current Medications, Allergies, Past Medical History, Past Surgical History, Family History and Social History were reviewed in [employer] record.     Review of Systems:  Patient denies any headaches, hearing loss, fatigue, blurred vision, shortness of breath, chest pain, abdominal pain, problems with bowel movements(takes miralax), urination, or intercourse. No joint pain or mood swings. Has had some heart palpitations, lasts just a few seconds, and seems to be more at night, sometimes feels like breath catches. (she wear a monitor in 2016, and was told it was normal) Lo loestrin is good, helps keep moods level and no period.  Physical Exam:BP 124/78 (BP Location: Left Arm, Patient Position: Sitting, Cuff Size: Normal)   Pulse 83   Ht 5' 3.75" (1.619 m)   Wt 150 lb (68 kg)   BMI 25.95 kg/m  General:  Well developed, well nourished, no acute distress Skin:  Warm and dry Neck:  Midline trachea,enlarged thyroid, good ROM, no lymphadenopathy Lungs; Clear to auscultation bilaterally Breast:  No dominant palpable mass, retraction, or nipple discharge Cardiovascular: Regular rate and rhythm Abdomen:  Soft, non tender, no hepatosplenomegaly Pelvic:  External genitalia is normal in appearance, no lesions.  The vagina is normal in appearance. Urethra has no lesions or masses. The cervix is bulbous,Pap with high risk HPV 16/18 genotyping and GC/CHL performed. Uterus is felt to be normal size, shape, and contour.  No adnexal masses or tenderness noted.Bladder is non tender, no masses felt. Rectal: Good sphincter tone, no polyps, or hemorrhoids felt.  Hemoccult negative. Extremities/musculoskeletal:  No swelling or varicosities noted, no clubbing or  cyanosis Psych:  No mood changes, alert and cooperative,seems happy Fall risk is low PHQ 2 score is 0 Examination chaperoned by Levy Pupa LPN.  Impression and Plan: 1. Encounter for gynecological examination with Papanicolaou smear of cervix Pap sent Physical in 1 year Pap in 3 if normal Mammogram yearly Check CBC,CMP,TSH and lipids  2. Enlarged thyroid Check TSH  3. Encounter for menstrual regulation Given  5 packs lo loestrin  4. Screening for colorectal cancer Colonoscopy at 44 Will rx miralax  Meds ordered this encounter  Medications  . DISCONTD: polyethylene glycol powder (MIRALAX) 17 GM/SCOOP powder    Sig: Take 255 g by mouth once for 1 dose.    Dispense:  255 g    Refill:  0    Order Specific Question:   Supervising Provider    Answer:   EURE, LUTHER H [[id number]]  . polyethylene glycol (MIRALAX / GLYCOLAX) 17 g packet    Sig: Take 17 g by mouth daily.    Dispense:  14 each    Refill:  prn    Order Specific Question:   Supervising Provider    Answer:   Elonda Husky, LUTHER H [[id number]]   5. Palpitations Will refer to cardiology

## 2019-06-20 ENCOUNTER — Other Ambulatory Visit: Payer: Self-pay | Admitting: Family Medicine

## 2019-06-20 DIAGNOSIS — Z1231 Encounter for screening mammogram for malignant neoplasm of breast: Secondary | ICD-10-CM

## 2019-06-21 LAB — COMPREHENSIVE METABOLIC PANEL
ALT: 19 IU/L (ref 0–32)
AST: 24 IU/L (ref 0–40)
Albumin/Globulin Ratio: 1.7 (ref 1.2–2.2)
Albumin: 4.1 g/dL (ref 3.8–4.8)
Alkaline Phosphatase: 46 IU/L (ref 39–117)
BUN/Creatinine Ratio: 17 (ref 9–23)
BUN: 16 mg/dL (ref 6–24)
Bilirubin Total: 0.3 mg/dL (ref 0.0–1.2)
CO2: 22 mmol/L (ref 20–29)
Calcium: 9 mg/dL (ref 8.7–10.2)
Chloride: 107 mmol/L — ABNORMAL HIGH (ref 96–106)
Creatinine, Ser: 0.96 mg/dL (ref 0.57–1.00)
GFR calc Af Amer: 84 mL/min/{1.73_m2} (ref >59)
GFR calc non Af Amer: 73 mL/min/{1.73_m2} (ref >59)
Globulin, Total: 2.4 g/dL (ref 1.5–4.5)
Glucose: 82 mg/dL (ref 65–99)
Potassium: 4.4 mmol/L (ref 3.5–5.2)
Sodium: 140 mmol/L (ref 134–144)
Total Protein: 6.5 g/dL (ref 6.0–8.5)

## 2019-06-21 LAB — LIPID PANEL
Chol/HDL Ratio: 3.2 ratio (ref 0.0–4.4)
Cholesterol, Total: 156 mg/dL (ref 100–199)
HDL: 49 mg/dL (ref >39)
LDL Chol Calc (NIH): 82 mg/dL (ref 0–99)
Triglycerides: 143 mg/dL (ref 0–149)
VLDL Cholesterol Cal: 25 mg/dL (ref 5–40)

## 2019-06-21 LAB — CBC
Hematocrit: 36.9 % (ref 34.0–46.6)
Hemoglobin: 12.4 g/dL (ref 11.1–15.9)
MCH: 30.4 pg (ref 26.6–33.0)
MCHC: 33.6 g/dL (ref 31.5–35.7)
MCV: 90 fL (ref 79–97)
Platelets: 223 10*3/uL (ref 150–450)
RBC: 4.08 x10E6/uL (ref 3.77–5.28)
RDW: 13 % (ref 11.7–15.4)
WBC: 6.2 10*3/uL (ref 3.4–10.8)

## 2019-06-21 LAB — TSH: TSH: 1.62 u[IU]/mL (ref 0.450–4.500)

## 2019-06-22 ENCOUNTER — Telehealth: Payer: Self-pay | Admitting: Internal Medicine

## 2019-06-22 ENCOUNTER — Encounter: Payer: Self-pay | Admitting: Internal Medicine

## 2019-06-22 ENCOUNTER — Ambulatory Visit: Payer: 59

## 2019-06-22 ENCOUNTER — Ambulatory Visit (INDEPENDENT_AMBULATORY_CARE_PROVIDER_SITE_OTHER): Payer: 59 | Admitting: Internal Medicine

## 2019-06-22 ENCOUNTER — Encounter: Payer: Self-pay | Admitting: Adult Health

## 2019-06-22 ENCOUNTER — Other Ambulatory Visit: Payer: Self-pay

## 2019-06-22 VITALS — BP 108/60 | HR 83 | Temp 98.5°F | Ht 63.5 in | Wt 152.0 lb

## 2019-06-22 DIAGNOSIS — R002 Palpitations: Secondary | ICD-10-CM | POA: Diagnosis not present

## 2019-06-22 DIAGNOSIS — R87618 Other abnormal cytological findings on specimens from cervix uteri: Secondary | ICD-10-CM | POA: Insufficient documentation

## 2019-06-22 DIAGNOSIS — R8789 Other abnormal findings in specimens from female genital organs: Secondary | ICD-10-CM | POA: Insufficient documentation

## 2019-06-22 LAB — CYTOLOGY - PAP
Chlamydia: NEGATIVE
Comment: NEGATIVE
Comment: NEGATIVE
Comment: NEGATIVE
Comment: NORMAL
HPV 16: NEGATIVE
HPV 18 / 45: NEGATIVE
High risk HPV: POSITIVE — AB
Neisseria Gonorrhea: NEGATIVE

## 2019-06-22 NOTE — Telephone Encounter (Signed)
  Precert needed for:  ZIO monitor 36 hrs

## 2019-06-22 NOTE — Patient Instructions (Signed)
Medication Instructions:  Your physician recommends that you continue on your current medications as directed. Please refer to the Current Medication list given to you today.  *If you need a refill on your cardiac medications before your next appointment, please call your pharmacy*  Lab Work: NONE  If you have labs (blood work) drawn today and your tests are completely normal, you will receive your results only by: Marland Kitchen MyChart Message (if you have MyChart) OR . A paper copy in the mail If you have any lab test that is abnormal or we need to change your treatment, we will call you to review the results.  Testing/Procedures: Your physician has recommended that you wear a holter monitor. Holter monitors are medical devices that record the heart's electrical activity. Doctors most often use these monitors to diagnose arrhythmias. Arrhythmias are problems with the speed or rhythm of the heartbeat. The monitor is a small, portable device. You can wear one while you do your normal daily activities. This is usually used to diagnose what is causing palpitations/syncope (passing out).    Follow-Up: At [facility], you and your health needs are our priority.  As part of our continuing mission to provide you with exceptional heart care, we have created designated Provider Care Teams.  These Care Teams include your primary Cardiologist (physician) and Advanced Practice Providers (APPs -  Physician Assistants and Nurse Practitioners) who all work together to provide you with the care you need, when you need it.  Your next appointment:   1 year(s)  The format for your next appointment:   In Person  Provider:   Dorris Carnes, MD  Other Instructions Thank you for choosing [facility]!

## 2019-06-22 NOTE — Progress Notes (Signed)
Cardiology Office Note   Date:  06/22/2019   ID:  Kristi Medina, DOB January 22, 1976, MRN [id number]  PCP:  Mikey Kirschner, MD  Cardiologist:   Dorris Carnes, MD   Pt referred for evaluation of palpitations by Electa Sniff    History of Present Illness: Kristi Medina is a 44 y.o. female with a history of Hashimoto's thyroidis and palpitations  She was seen by Liane Comber in 2016   She wore  A 24 hour holter which showed infrequent PACs,PVCs. She was seen in [facility] recently   Recounted that she was having palpitations Pt says she notices more at rest, not when she is doing things   She will have 2 to 3 beats as skips then it will stop.  Denies dizziness with spells   Will get a little SOB if skips more than once.     Does note bumps in BP at times up to 190/  On valsartan   80/  She is active   Works as Marine scientist   No DOE   No CP No presyncope/syncope      Current Meds  Medication Sig  . LO LOESTRIN FE 1 MG-10 MCG / 10 MCG tablet Take 1 tablet by mouth once daily  . polyethylene glycol (MIRALAX / GLYCOLAX) 17 g packet Take 17 g by mouth daily.  . valsartan (DIOVAN) 80 MG tablet Take 1 tablet (80 mg total) by mouth at bedtime.     Allergies:   Patient has no known allergies.   Past Medical History:  Diagnosis Date  . ALLERGIC RHINITIS 06/11/2010  . Blood in urine 08/22/2015  . BV (bacterial vaginosis) 12/05/2012  . Constipation, chronic 08/22/2015  . Enlarged thyroid 08/22/2015  . GOITER, MULTINODULAR 06/11/2010  . RUQ pain 08/22/2015  . THYROIDITIS 06/11/2010    Past Surgical History:  Procedure Laterality Date  . ENDOMETRIAL ABLATION  2006   for menorrhagia  . TUBAL LIGATION  2006     Social History:  The patient  reports that she has never smoked. She has never used smokeless tobacco. She reports current alcohol use. She reports that she does not use drugs.   Family History:  The patient's family history includes Alcohol abuse in an other family member; Cancer in some other  family members; Diabetes in her father and another family member; Goiter in an other family member; Hypertension in her father, mother, and another family member; Stroke in her father and another family member; Thyroid disease in her brother.    ROS:  Please see the history of present illness. All other systems are reviewed and  Negative to the above problem except as noted.    PHYSICAL EXAM: VS:  BP 108/60   Pulse 83   Temp 98.5 F (36.9 C)   Ht 5' 3.5" (1.613 m)   Wt 152 lb (68.9 kg)   SpO2 99%   BMI 26.50 kg/m   GEN: Well nourished, well developed, in no acute distress  HEENT: normal  Neck: no JVD, carotid bruits Cardiac: RRR; no murmurs, rubs, or gallops,no edema  Respiratory:  clear to auscultation bilaterally, normal work of breathing GI: soft, nontender, nondistended, + BS  No hepatomegaly  MS: no deformity Moving all extremities   Skin: warm and dry, no rash Neuro:  Strength and sensation are intact Psych: euthymic mood, full affect   EKG:  EKG is ordered today.   Lipid Panel    Component Value Date/Time   CHOL 156 06/20/2019 0859  TRIG 143 06/20/2019 0859   HDL 49 06/20/2019 0859   CHOLHDL 3.2 06/20/2019 0859   LDLCALC 82 06/20/2019 0859      Wt Readings from Last 3 Encounters:  06/22/19 152 lb (68.9 kg)  06/18/19 150 lb (68 kg)  12/06/18 142 lb 9.6 oz (64.7 kg)      ASSESSMENT AND PLAN:  1  Palpitations   Spells sound like probable PACs   Short   Not hemodynamically compromising  I do not think they would progress to sustained arrhythmia Would set up for an event monitor to document    Stay hydrated   Stay active    Avoid excess caffeine or stimulants    2  HTN   Started on Valsartan.   BP was labile with jumps     Now OK,  I question dosing needs   REcomm pt cut back to 40   Follow   WIll review monitor  COuld consider low dose  b blocker which would help palpitations and BP  (decrease adrenergic drive/tone) WIll be in touch with pt re results      Tentative plan for f/u in 1 year       Current medicines are reviewed at length with the patient today.  The patient does not have concerns regarding medicines.  Signed, Dorris Carnes, MD  06/22/2019 10:21 AM    [address], [place], [place]  [address] Phone: [phone]; Fax: [phone]

## 2019-07-09 ENCOUNTER — Encounter: Payer: Self-pay | Admitting: Family Medicine

## 2019-07-09 ENCOUNTER — Other Ambulatory Visit: Payer: Self-pay

## 2019-07-24 ENCOUNTER — Other Ambulatory Visit: Payer: Self-pay

## 2019-07-24 ENCOUNTER — Ambulatory Visit
Admission: RE | Admit: 2019-07-24 | Discharge: 2019-07-24 | Disposition: A | Discharge status: Home / Self Care | Payer: 59 | Source: Ambulatory Visit | Attending: Family Medicine | Admitting: Family Medicine

## 2019-07-24 DIAGNOSIS — Z1231 Encounter for screening mammogram for malignant neoplasm of breast: Secondary | ICD-10-CM

## 2019-07-25 ENCOUNTER — Other Ambulatory Visit: Payer: Self-pay | Admitting: Family Medicine

## 2019-07-25 DIAGNOSIS — R928 Other abnormal and inconclusive findings on diagnostic imaging of breast: Secondary | ICD-10-CM

## 2019-08-14 ENCOUNTER — Ambulatory Visit
Admission: RE | Admit: 2019-08-14 | Discharge: 2019-08-14 | Disposition: A | Discharge status: Home / Self Care | Payer: 59 | Source: Ambulatory Visit | Attending: Family Medicine | Admitting: Family Medicine

## 2019-08-14 ENCOUNTER — Other Ambulatory Visit: Payer: Self-pay | Admitting: Family Medicine

## 2019-08-14 ENCOUNTER — Other Ambulatory Visit: Payer: Self-pay

## 2019-08-14 DIAGNOSIS — R928 Other abnormal and inconclusive findings on diagnostic imaging of breast: Secondary | ICD-10-CM

## 2019-09-28 ENCOUNTER — Telehealth: Payer: Self-pay | Admitting: Adult Health

## 2019-09-28 MED ORDER — LORAZEPAM 0.5 MG PO TABS: ORAL_TABLET | ORAL | 0 refills | Status: DC

## 2019-09-28 NOTE — Telephone Encounter (Signed)
Pt called, having anxiety over recent friend leaving, has lost about 8 lbs in 2 weeks and teary at times, will rx ativan,may need to go to counseling.

## 2019-11-07 ENCOUNTER — Other Ambulatory Visit: Payer: Self-pay | Admitting: Adult Health

## 2019-12-21 ENCOUNTER — Telehealth: Payer: Self-pay | Admitting: Adult Health

## 2019-12-21 MED ORDER — LORAZEPAM 0.5 MG PO TABS: ORAL_TABLET | ORAL | 0 refills | Status: DC

## 2019-12-21 NOTE — Telephone Encounter (Signed)
Pt requests refill on ativan will do

## 2020-06-18 ENCOUNTER — Ambulatory Visit (INDEPENDENT_AMBULATORY_CARE_PROVIDER_SITE_OTHER): Payer: 59 | Admitting: Adult Health

## 2020-06-18 ENCOUNTER — Other Ambulatory Visit (HOSPITAL_COMMUNITY)
Admission: RE | Admit: 2020-06-18 | Discharge: 2020-06-18 | Disposition: A | Discharge status: Home / Self Care | Payer: 59 | Source: Ambulatory Visit | Attending: Adult Health | Admitting: Adult Health

## 2020-06-18 ENCOUNTER — Encounter: Payer: Self-pay | Admitting: Adult Health

## 2020-06-18 ENCOUNTER — Other Ambulatory Visit: Payer: Self-pay

## 2020-06-18 VITALS — BP 130/89 | HR 90 | Ht 63.5 in | Wt 139.2 lb

## 2020-06-18 DIAGNOSIS — Z1211 Encounter for screening for malignant neoplasm of colon: Secondary | ICD-10-CM | POA: Diagnosis not present

## 2020-06-18 DIAGNOSIS — I10 Essential (primary) hypertension: Secondary | ICD-10-CM | POA: Insufficient documentation

## 2020-06-18 DIAGNOSIS — K5900 Constipation, unspecified: Secondary | ICD-10-CM | POA: Insufficient documentation

## 2020-06-18 DIAGNOSIS — Z01419 Encounter for gynecological examination (general) (routine) without abnormal findings: Secondary | ICD-10-CM | POA: Insufficient documentation

## 2020-06-18 DIAGNOSIS — R195 Other fecal abnormalities: Secondary | ICD-10-CM | POA: Insufficient documentation

## 2020-06-18 DIAGNOSIS — E049 Nontoxic goiter, unspecified: Secondary | ICD-10-CM

## 2020-06-18 DIAGNOSIS — Z7689 Persons encountering health services in other specified circumstances: Secondary | ICD-10-CM

## 2020-06-18 DIAGNOSIS — Z8742 Personal history of other diseases of the female genital tract: Secondary | ICD-10-CM | POA: Insufficient documentation

## 2020-06-18 DIAGNOSIS — R002 Palpitations: Secondary | ICD-10-CM

## 2020-06-18 LAB — HEMOCCULT GUIAC POC 1CARD (OFFICE): Fecal Occult Blood, POC: NEGATIVE

## 2020-06-18 MED ORDER — PROPRANOLOL HCL 10 MG PO TABS: ORAL_TABLET | ORAL | 3 refills | Status: DC

## 2020-06-18 NOTE — Progress Notes (Signed)
Patient ID: Kristi Medina, female   DOB: 12-Jan-1976, 45 y.o.   MRN: [id number] History of Present Illness:  Kristi Medina is a 45 year old white female,divorced, G2P2 in for a well woman gyn exam and pap. Her pap 06/18/19 was LSIL with +HRHPV. 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 PCP is Dr Wolfgang Phoenix  Current Medications, Allergies, Past Medical History, Past Surgical History, Family History and Social History were reviewed in [employer] record.     Review of Systems:  Patient denies any headaches, hearing loss, fatigue, blurred vision, shortness of breath, chest pain, abdominal pain, problems with  urination, or intercourse(not active at present). No joint pain or mood swings. Has chronic constipation(uses miralax) and has had change in stool caliber/size. Has had some spotting on OCs sp ablation, +palpitations, has seen cardiology   Physical Exam:BP 130/89 (BP Location: Right Arm, Patient Position: Sitting, Cuff Size: Normal)   Pulse 90   Ht 5' 3.5" (1.613 m)   Wt 139 lb 3.2 oz (63.1 kg)   LMP 06/17/2020   BMI 24.27 kg/m  General:  Well developed, well nourished, no acute distress Skin:  Warm and dry Neck:  Midline trachea, enlarged thyroid, good ROM, no lymphadenopathy Lungs; Clear to auscultation bilaterally Breast:  No dominant palpable mass, retraction, or nipple discharge Cardiovascular: Regular rate and rhythm Abdomen:  Soft, non tender, no hepatosplenomegaly Pelvic:  External genitalia is normal in appearance, no lesions.  The vagina is normal in appearance. Urethra has no lesions or masses. The cervix is bulbous, has several small nabothian cysts, pap with GC/CHL and HR HPV genotyping performed.  Uterus is felt to be normal size, shape, and contour.  No adnexal masses or tenderness noted.Bladder is non tender, no masses felt. Rectal: Good sphincter tone, no polyps, or hemorrhoids felt.  Hemoccult negative. Extremities/musculoskeletal:  No swelling or  varicosities noted, no clubbing or cyanosis Psych:  No mood changes, alert and cooperative,seems happy AA is 1 Fall risk is low PHQ 9 score is 1 GAD 7 score is 0  Upstream - 06/18/20 0935      Pregnancy Intention Screening   Does the patient want to become pregnant in the next year? No    Does the patient's partner want to become pregnant in the next year? No    Would the patient like to discuss contraceptive options today? No      Contraception Wrap Up   Current Method Female Sterilization    End Method Female Sterilization   on oCs for period control   Contraception Counseling Provided No         Examination chaperoned by [employer] RN  Impression and Plan: 1. Encounter for gynecological examination with Papanicolaou smear of cervix Pap sent Physical in 1 year  Pap in 3 if normal Get mammogram in March  - Cytology - PAP( [facility]) - CBC - Comprehensive metabolic panel - TSH - Lipid panel  2. Encounter for menstrual regulation Continue lo loestrin, gave 6 sample packs   3. Enlarged thyroid - TSH  4. Encounter for screening fecal occult blood testing - POCT occult blood stool-negative   5. Constipation, unspecified constipation type - Ambulatory referral to Gastroenterology  6. Change in stool caliber - Ambulatory referral to Gastroenterology, [place]  7. Palpitations Will try inderal  8. Hypertension, unspecified type Will try Inderal Meds ordered this encounter  Medications  . propranolol (INDERAL) 10 MG tablet    Sig: Take 1 daily    Dispense:  30 tablet    Refill:  3    Order Specific Question:   Supervising Provider    Answer:   Tania Ade H [[id number]]  Follow up in 8 weeks for BP check   9. History of abnormal cervical Pap smear Pap sent

## 2020-06-19 LAB — LIPID PANEL
Chol/HDL Ratio: 3.3 ratio (ref 0.0–4.4)
Cholesterol, Total: 160 mg/dL (ref 100–199)
HDL: 48 mg/dL (ref >39)
LDL Chol Calc (NIH): 78 mg/dL (ref 0–99)
Triglycerides: 203 mg/dL — ABNORMAL HIGH (ref 0–149)
VLDL Cholesterol Cal: 34 mg/dL (ref 5–40)

## 2020-06-19 LAB — COMPREHENSIVE METABOLIC PANEL
ALT: 18 IU/L (ref 0–32)
AST: 20 IU/L (ref 0–40)
Albumin/Globulin Ratio: 1.7 (ref 1.2–2.2)
Albumin: 4.3 g/dL (ref 3.8–4.8)
Alkaline Phosphatase: 51 IU/L (ref 44–121)
BUN/Creatinine Ratio: 11 (ref 9–23)
BUN: 12 mg/dL (ref 6–24)
Bilirubin Total: 0.3 mg/dL (ref 0.0–1.2)
CO2: 21 mmol/L (ref 20–29)
Calcium: 9.6 mg/dL (ref 8.7–10.2)
Chloride: 104 mmol/L (ref 96–106)
Creatinine, Ser: 1.11 mg/dL — ABNORMAL HIGH (ref 0.57–1.00)
GFR calc Af Amer: 70 mL/min/{1.73_m2} (ref >59)
GFR calc non Af Amer: 61 mL/min/{1.73_m2} (ref >59)
Globulin, Total: 2.6 g/dL (ref 1.5–4.5)
Glucose: 94 mg/dL (ref 65–99)
Potassium: 4.5 mmol/L (ref 3.5–5.2)
Sodium: 139 mmol/L (ref 134–144)
Total Protein: 6.9 g/dL (ref 6.0–8.5)

## 2020-06-19 LAB — CBC
Hematocrit: 43.2 % (ref 34.0–46.6)
Hemoglobin: 14.2 g/dL (ref 11.1–15.9)
MCH: 29.5 pg (ref 26.6–33.0)
MCHC: 32.9 g/dL (ref 31.5–35.7)
MCV: 90 fL (ref 79–97)
Platelets: 284 10*3/uL (ref 150–450)
RBC: 4.82 x10E6/uL (ref 3.77–5.28)
RDW: 12.3 % (ref 11.7–15.4)
WBC: 6.9 10*3/uL (ref 3.4–10.8)

## 2020-06-19 LAB — TSH: TSH: 2.08 u[IU]/mL (ref 0.450–4.500)

## 2020-06-20 LAB — CYTOLOGY - PAP
Chlamydia: NEGATIVE
Comment: NEGATIVE
Comment: NEGATIVE
Comment: NORMAL
High risk HPV: NEGATIVE
Neisseria Gonorrhea: NEGATIVE

## 2020-07-03 ENCOUNTER — Other Ambulatory Visit: Payer: 59

## 2020-07-03 ENCOUNTER — Ambulatory Visit: Payer: 59 | Admitting: Nurse Practitioner

## 2020-07-03 ENCOUNTER — Encounter: Payer: Self-pay | Admitting: Nurse Practitioner

## 2020-07-03 VITALS — BP 102/70 | HR 88 | Ht 63.5 in | Wt 141.2 lb

## 2020-07-03 DIAGNOSIS — Z1211 Encounter for screening for malignant neoplasm of colon: Secondary | ICD-10-CM

## 2020-07-03 DIAGNOSIS — Z800 Family history of malignant neoplasm of digestive organs: Secondary | ICD-10-CM

## 2020-07-03 DIAGNOSIS — Z8371 Family history of colonic polyps: Secondary | ICD-10-CM

## 2020-07-03 DIAGNOSIS — K5900 Constipation, unspecified: Secondary | ICD-10-CM | POA: Diagnosis not present

## 2020-07-03 NOTE — Addendum Note (Signed)
Addended by: Jacob Moores on: 07/03/2020 09:18 AM   Modules accepted: Orders

## 2020-07-03 NOTE — Progress Notes (Signed)
____________________________________________________________  Attending physician addendum:  Thank you for sending this case to me. I have reviewed the entire note and agree with the plan.   Maygan Koeller Danis, MD  ____________________________________________________________  

## 2020-07-03 NOTE — Patient Instructions (Signed)
You have been scheduled for a colonoscopy. Please follow written instructions given to you at your visit today.  Please pick up your prep supplies at the pharmacy within the next 1-3 days. If you use inhalers (even only as needed), please bring them with you on the day of your procedure.  LABS: Your provider has requested that you go to the basement level for lab work before leaving today. Press "B" on the elevator. The lab is located at the first door on the left as you exit the elevator.  HEALTHCARE LAWS AND MY CHART RESULTS: Due to recent changes in healthcare laws, you may see the results of your imaging and laboratory studies on MyChart before your provider has had a chance to review them.  We understand that in some cases there may be results that are confusing or concerning to you. Not all laboratory results come back in the same time frame and the provider may be waiting for multiple results in order to interpret others.  Please give [place] 48 hours in order for your provider to thoroughly review all the results before contacting the office for clarification of your results.   OVER THE COUNTER MEDICATION  Please purchase the following medications over the counter and take as directed:  Miralax. Dissolve one capful in 8 ounces of water and drink before bed.            Benefiber- 1 tablespoon daily as tolerated.  It was great seeing you today!  Thank you for entrusting me with your care and choosing [facility].  Noralyn Pick, CRNP   If you are age 17 or older, your body mass index should be between 23-30. Your Body mass index is 24.62 kg/m. If this is out of the aforementioned range listed, please consider follow up with your Primary Care Provider.  If you are age 1 or younger, your body mass index should be between 19-25. Your Body mass index is 24.62 kg/m. If this is out of the aformentioned range listed, please consider follow up with your Primary Care Provider.

## 2020-07-03 NOTE — Progress Notes (Signed)
07/03/2020 Kristi Medina [id number] 1975-12-21   CHIEF COMPLAINT: Chronic constipation, change in stool diameter  HISTORY OF PRESENT ILLNESS:  Kristi Medina is a 45 year old female with a past medical history of hypertension, multi nodular goiter, Hashimoto's thyroiditis and chronic constipation. Past endometrial ablation and tubal ligation. She presents to our office today as referred by Derrek Monaco NP for further evaluation regarding constipation.  She reports having constipation for the past year which has been fairly well controlled on taking MiraLAX daily.  However, her stools are intermittently flat and narrow in diameter for the past 4 months.  Her stools are soft and brown which are passed without significant straining.  She occasionally notices black pepper-like specks on the toilet tissue when wiping.  No bright rectal bleeding.  No upper or lower abdominal pain.  No rectal pain.  No dysphagia or heartburn.  No NSAID use.  She underwent an EGD and colonoscopy by Dr. Gala Romney or Dr. Laural Golden approximately 15 years ago. She reported the EGD showed ulcers likely due to a high stress level, no evidence of H. Pylori and her  colonoscopy was normal.  Her mother and brother have a history of colon polyps.  Her maternal grandmother was diagnosed with colon cancer in her 10s.  He wishes to schedule a colonoscopy.  Her daughter has gluten intolerance without a definitive diagnosis of celiac disease.   CBC Latest Ref Rng & Units 06/18/2020 06/20/2019 12/06/2018  WBC 3.4 - 10.8 x10E3/uL 6.9 6.2 8.5  Hemoglobin 11.1 - 15.9 g/dL 14.2 12.4 13.3  Hematocrit 34.0 - 46.6 % 43.2 36.9 40.2  Platelets 150 - 450 x10E3/uL 284 223 259    CMP Latest Ref Rng & Units 06/18/2020 06/20/2019 02/23/2019  Glucose 65 - 99 mg/dL 94 82 78  BUN 6 - 24 mg/dL 12 16 13   Creatinine 0.57 - 1.00 mg/dL 1.11(H) 0.96 1.07(H)  Sodium 134 - 144 mmol/L 139 140 141  Potassium 3.5 - 5.2 mmol/L 4.5 4.4 4.3  Chloride 96 - 106 mmol/L  104 107(H) 103  CO2 20 - 29 mmol/L 21 22 24   Calcium 8.7 - 10.2 mg/dL 9.6 9.0 9.7  Total Protein 6.0 - 8.5 g/dL 6.9 6.5 -  Total Bilirubin 0.0 - 1.2 mg/dL 0.3 0.3 -  Alkaline Phos 44 - 121 IU/L 51 46 -  AST 0 - 40 IU/L 20 24 -  ALT 0 - 32 IU/L 18 19 -  TSH 2.080.   Past Medical History:  Diagnosis Date  . ALLERGIC RHINITIS 06/11/2010  . Blood in urine 08/22/2015  . BV (bacterial vaginosis) 12/05/2012  . Constipation, chronic 08/22/2015  . Enlarged thyroid 08/22/2015  . GOITER, MULTINODULAR 06/11/2010  . Hypertension   . RUQ pain 08/22/2015  . THYROIDITIS 06/11/2010  . Vaginal Pap smear, abnormal    Past Surgical History:  Procedure Laterality Date  . ENDOMETRIAL ABLATION  2006   for menorrhagia  . TUBAL LIGATION  2006   Social History: She is divorced.  She has 1 daughter and 1 son.  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  Non-smoker.  No alcohol or drug use.  Family History: Father with diabetes.  Mother with history of colon polyps.  Brother with colon polyps.  Maternal grandmother was diagnosed with colon cancer in her mid 52s.  No Known Allergies    Outpatient Encounter Medications as of 07/03/2020  Medication Sig  . LO LOESTRIN FE 1 MG-10 MCG / 10 MCG tablet Take  1 tablet by mouth once daily  . LORazepam (ATIVAN) 0.5 MG tablet TAKE 1/2 TO 1 (ONE-HALF TO ONE) TABLET BY MOUTH EVERY 8 HOURS AS NEEDED FOR ANXIETY  . polyethylene glycol (MIRALAX / GLYCOLAX) 17 g packet Take 17 g by mouth daily.  . propranolol (INDERAL) 10 MG tablet Take 1 daily   No facility-administered encounter medications on file as of 07/03/2020.    REVIEW OF SYSTEMS: Gen: Denies fever, sweats or chills. No weight loss.  CV: Denies chest pain, palpitations or edema. Resp: Denies cough, shortness of breath of hemoptysis.  GI: See HPI. GU : Denies urinary burning, blood in urine, increased urinary frequency or incontinence. MS: Denies joint pain, muscles aches or weakness. Derm: Denies rash,  itchiness, skin lesions or unhealing ulcers. Psych: Denies depression, anxiety or memory loss. Heme: Denies bruising, bleeding. Neuro:  Denies headaches, dizziness or paresthesias. Endo:  Denies any problems with DM, thyroid or adrenal function.  PHYSICAL EXAM: LMP 06/17/2020   BP 102/70   Pulse 88   Ht 5' 3.5" (1.613 m)   Wt 141 lb 3.2 oz (64 kg)   LMP 06/17/2020   SpO2 99%   BMI 24.62 kg/m  General: Well developed 45 year old female in no acute distress. Head: Normocephalic and atraumatic. Eyes:  Sclerae non-icteric, conjunctive pink. Ears: Normal auditory acuity. Mouth: Dentition intact. No ulcers or lesions.  Neck: Supple, no lymphadenopathy or thyromegaly.  Lungs: Clear bilaterally to auscultation without wheezes, crackles or rhonchi. Heart: Regular rate and rhythm. No murmur, rub or gallop appreciated.  Abdomen: Soft, nontender, non distended. No masses. No hepatosplenomegaly. Normoactive bowel sounds x 4 quadrants.  Rectal: Deferred. Musculoskeletal: Symmetrical with no gross deformities. Skin: Warm and dry. No rash or lesions on visible extremities. Extremities: No edema. Neurological: Alert oriented x 4, no focal deficits.  Psychological:  Alert and cooperative. Normal mood and affect.  ASSESSMENT AND PLAN:  66. 45 year old female with change in bowel pattern, narrow stool diameter, constipation.  Family history of colon polyps (mother and brother).  Family history of colorectal cancer (maternal grandmother). -Benefiber 1 tablespoon daily -MiraLAX once or twice daily as needed -Colonoscopy benefits and risks discussed including risk with sedation, risk of bleeding, perforation and infection  -TTG, IgA -Further follow-up to be determined after the above evaluation completed  2.  Mildly elevated creatinine level 1.11 -Vies patient to drink 8 glasses of water daily -Repeat BUN/creatinine with PCP in 2 to 3 months recommended         CC:  Estill Dooms,  NP

## 2020-07-04 LAB — IGA: Immunoglobulin A: 304 mg/dL (ref 47–310)

## 2020-07-04 LAB — TISSUE TRANSGLUTAMINASE ABS,IGG,IGA
(tTG) Ab, IgA: <1 U/mL
(tTG) Ab, IgG: <1 U/mL

## 2020-08-13 ENCOUNTER — Other Ambulatory Visit: Payer: Self-pay

## 2020-08-13 ENCOUNTER — Other Ambulatory Visit: Payer: Self-pay | Admitting: Family Medicine

## 2020-08-13 ENCOUNTER — Encounter: Payer: Self-pay | Admitting: Adult Health

## 2020-08-13 ENCOUNTER — Ambulatory Visit (INDEPENDENT_AMBULATORY_CARE_PROVIDER_SITE_OTHER): Payer: 59 | Admitting: Adult Health

## 2020-08-13 VITALS — BP 133/86 | HR 83 | Ht 63.5 in | Wt 145.0 lb

## 2020-08-13 DIAGNOSIS — Z1231 Encounter for screening mammogram for malignant neoplasm of breast: Secondary | ICD-10-CM

## 2020-08-13 DIAGNOSIS — I10 Essential (primary) hypertension: Secondary | ICD-10-CM

## 2020-08-13 DIAGNOSIS — R7989 Other specified abnormal findings of blood chemistry: Secondary | ICD-10-CM | POA: Diagnosis not present

## 2020-08-13 LAB — POCT URINALYSIS DIPSTICK
Blood, UA: NEGATIVE
Glucose, UA: NEGATIVE
Leukocytes, UA: NEGATIVE
Nitrite, UA: NEGATIVE
Protein, UA: NEGATIVE

## 2020-08-13 MED ORDER — PROPRANOLOL HCL 10 MG PO TABS: ORAL_TABLET | ORAL | 3 refills | Status: DC

## 2020-08-13 NOTE — Progress Notes (Signed)
  Subjective:     Patient ID: Kristi Medina, female   DOB: 30-Jul-1975, 45 y.o.   MRN: [id number]  HPI Kristi Medina is a 45 year old white female, divorced, G2P2 in for BP check,has been normal at home. Is having colonoscopy  08/21/20. PCP is Dr Wolfgang Phoenix.   Review of Systems Stress at work Tired at times Reviewed past medical,surgical, social and family history. Reviewed medications and allergies.     Objective:   Physical Exam BP 133/86 (BP Location: Right Arm, Patient Position: Sitting, Cuff Size: Normal)   Pulse 83   Ht 5' 3.5" (1.613 m)   Wt 145 lb (65.8 kg)   BMI 25.28 kg/m urine dipstick is negative Skin warm and dry. Lungs: clear to ausculation bilaterally. Cardiovascular: regular rate and rhythm.  Upstream - 08/13/20 1539      Pregnancy Intention Screening   Does the patient want to become pregnant in the next year? No    Does the patient's partner want to become pregnant in the next year? No    Would the patient like to discuss contraceptive options today? No      Contraception Wrap Up   Current Method Female Sterilization    End Method Female Sterilization    Contraception Counseling Provided No             Assessment:     1. Elevated serum creatinine Check CMP  2. Hypertension, unspecified type Continue inderal Meds ordered this encounter  Medications  . propranolol (INDERAL) 10 MG tablet    Sig: Take 1 daily    Dispense:  30 tablet    Refill:  3    Order Specific Question:   Supervising Provider    Answer:   Florian Buff [[id number]]      Plan:    Keep check on BP at home  Follow up in 3 months

## 2020-08-14 LAB — COMPREHENSIVE METABOLIC PANEL
ALT: 27 IU/L (ref 0–32)
AST: 24 IU/L (ref 0–40)
Albumin/Globulin Ratio: 1.3 (ref 1.2–2.2)
Albumin: 3.9 g/dL (ref 3.8–4.8)
Alkaline Phosphatase: 50 IU/L (ref 44–121)
BUN/Creatinine Ratio: 12 (ref 9–23)
BUN: 12 mg/dL (ref 6–24)
Bilirubin Total: <0.2 mg/dL (ref 0.0–1.2)
CO2: 20 mmol/L (ref 20–29)
Calcium: 9.3 mg/dL (ref 8.7–10.2)
Chloride: 106 mmol/L (ref 96–106)
Creatinine, Ser: 1 mg/dL (ref 0.57–1.00)
Globulin, Total: 3.1 g/dL (ref 1.5–4.5)
Glucose: 89 mg/dL (ref 65–99)
Potassium: 4.1 mmol/L (ref 3.5–5.2)
Sodium: 141 mmol/L (ref 134–144)
Total Protein: 7 g/dL (ref 6.0–8.5)
eGFR: 71 mL/min/{1.73_m2} (ref >59)

## 2020-08-19 ENCOUNTER — Encounter: Payer: Self-pay | Admitting: Gastroenterology

## 2020-08-21 ENCOUNTER — Other Ambulatory Visit: Payer: Self-pay

## 2020-08-21 ENCOUNTER — Ambulatory Visit (AMBULATORY_SURGERY_CENTER): Payer: 59 | Admitting: Gastroenterology

## 2020-08-21 ENCOUNTER — Encounter: Payer: Self-pay | Admitting: Gastroenterology

## 2020-08-21 VITALS — BP 112/81 | HR 79 | Temp 97.5°F | Resp 12 | Ht 63.5 in | Wt 145.0 lb

## 2020-08-21 DIAGNOSIS — D120 Benign neoplasm of cecum: Secondary | ICD-10-CM

## 2020-08-21 DIAGNOSIS — K5900 Constipation, unspecified: Secondary | ICD-10-CM

## 2020-08-21 DIAGNOSIS — K5730 Diverticulosis of large intestine without perforation or abscess without bleeding: Secondary | ICD-10-CM

## 2020-08-21 MED ORDER — SODIUM CHLORIDE 0.9 % IV SOLN: 500.0000 mL | Freq: Once | INTRAVENOUS | Status: DC

## 2020-08-21 NOTE — Progress Notes (Signed)
To PACU, VSS. Report to RN.tb 

## 2020-08-21 NOTE — Op Note (Signed)
[facility] Patient Name: Kristi Medina Procedure Date: 08/21/2020 1:38 PM MRN: [id number] Endoscopist: Mallie Mussel L. Loletha Carrow , MD Age: 45 Referring MD:  Date of Birth: 1975/07/21 Gender: Female Account #: [id number] Procedure:                Colonoscopy Indications:              Change in stool caliber, Constipation Medicines:                Monitored Anesthesia Care Procedure:                Pre-Anesthesia Assessment:                           - Prior to the procedure, a History and Physical                            was performed, and patient medications and                            allergies were reviewed. The patient's tolerance of                            previous anesthesia was also reviewed. The risks                            and benefits of the procedure and the sedation                            options and risks were discussed with the patient.                            All questions were answered, and informed consent                            was obtained. Prior Anticoagulants: The patient has                            taken no previous anticoagulant or antiplatelet                            agents. ASA Grade Assessment: II - A patient with                            mild systemic disease. After reviewing the risks                            and benefits, the patient was deemed in                            satisfactory condition to undergo the procedure.                           After obtaining informed consent, the colonoscope  was passed under direct vision. Throughout the                            procedure, the patient's blood pressure, pulse, and                            oxygen saturations were monitored continuously. The                            Olympus CF-HQ190 [id number]) Colonoscope was                            introduced through the anus and advanced to the the                            terminal ileum, with  identification of the                            appendiceal orifice and IC valve. The colonoscopy                            was performed without difficulty. The patient                            tolerated the procedure well. The quality of the                            bowel preparation was good. The terminal ileum,                            ileocecal valve, appendiceal orifice, and rectum                            were photographed. Scope In: 1:49:16 PM Scope Out: 2:10:19 PM Scope Withdrawal Time: 0 hours 16 minutes 58 seconds  Total Procedure Duration: 0 hours 21 minutes 3 seconds  Findings:                 The perianal and digital rectal examinations were                            normal. Pre-sedation exam revealed normal resting                            and voluntary sphincter tone and puborectalis                            contraction. Normal rectal descent felt when                            bearing down.                           The terminal ileum appeared normal.  A 15 x 3 mm polyp was found in the ileocecal valve.                            The polyp was sessile. The polyp was removed with a                            piecemeal technique using a cold snare. Resection                            and retrieval were complete.                           Multiple diverticula were found in the left colon                            and right colon. There was associated haustral                            thickening and mild tortuosity in the left colon.                           The exam was otherwise without abnormality on                            direct and retroflexion views. Complications:            No immediate complications. Estimated Blood Loss:     Estimated blood loss was minimal. Impression:               - The examined portion of the ileum was normal.                           - One 15 x 3 mm polyp at the ileocecal valve,                             removed piecemeal using a cold snare. Resected and                            retrieved.                           - Diverticulosis in the left colon and in the right                            colon.                           - The examination was otherwise normal on direct                            and retroflexion views.                           Years of constipation that may be partially related  to pelvic floor dysfunction (birth trauma). More                            recent change is stool caliber due to divertiulosis. Recommendation:           - Patient has a contact number available for                            emergencies. The signs and symptoms of potential                            delayed complications were discussed with the                            patient. Return to normal activities tomorrow.                            Written discharge instructions were provided to the                            patient.                           - Resume previous diet.                           - Continue present medications, including the                            miralax that works well to relieve the                            constipation. Slightly elevate feet during bowel                            movement.                           - Await pathology results.                           - Repeat colonoscopy is recommended for                            surveillance. The colonoscopy date will be                            determined after pathology results from today's                            exam become available for review. [place] Gavitt L. Loletha Carrow, MD 08/21/2020 2:19:32 PM This report has been signed electronically.

## 2020-08-21 NOTE — Patient Instructions (Addendum)
Handouts on polyps & diverticulosis given to you today  Await pathology results on polyp removed   Continue present medications including Miralax for constipation   YOU HAD AN ENDOSCOPIC PROCEDURE TODAY AT [facility]:   Refer to the procedure report that was given to you for any specific questions about what was found during the examination.  If the procedure report does not answer your questions, please call your gastroenterologist to clarify.  If you requested that your care partner not be given the details of your procedure findings, then the procedure report has been included in a sealed envelope for you to review at your convenience later.  YOU SHOULD EXPECT: Some feelings of bloating in the abdomen. Passage of more gas than usual.  Walking can help get rid of the air that was put into your GI tract during the procedure and reduce the bloating. If you had a lower endoscopy (such as a colonoscopy or flexible sigmoidoscopy) you may notice spotting of blood in your stool or on the toilet paper. If you underwent a bowel prep for your procedure, you may not have a normal bowel movement for a few days.  Please Note:  You might notice some irritation and congestion in your nose or some drainage.  This is from the oxygen used during your procedure.  There is no need for concern and it should clear up in a day or so.  SYMPTOMS TO REPORT IMMEDIATELY:   Following lower endoscopy (colonoscopy or flexible sigmoidoscopy):  Excessive amounts of blood in the stool  Significant tenderness or worsening of abdominal pains  Swelling of the abdomen that is new, acute  Fever of 100F or higher    For urgent or emergent issues, a gastroenterologist can be reached at any hour by calling [phone]. Do not use MyChart messaging for urgent concerns.    DIET:  We do recommend a small meal at first, but then you may proceed to your regular diet.  Drink plenty of fluids but you should  avoid alcoholic beverages for 24 hours.  ACTIVITY:  You should plan to take it easy for the rest of today and you should NOT DRIVE or use heavy machinery until tomorrow (because of the sedation medicines used during the test).    FOLLOW UP: Our staff will call the number listed on your records 48-72 hours following your procedure to check on you and address any questions or concerns that you may have regarding the information given to you following your procedure. If we do not reach you, we will leave a message.  We will attempt to reach you two times.  During this call, we will ask if you have developed any symptoms of COVID 19. If you develop any symptoms (ie: fever, flu-like symptoms, shortness of breath, cough etc.) before then, please call [phone].  If you test positive for Covid 19 in the 2 weeks post procedure, please call and report this information to [place].    If any biopsies were taken you will be contacted by phone or by letter within the next 1-3 weeks.  Please call us at [phone] if you have not heard about the biopsies in 3 weeks.    SIGNATURES/CONFIDENTIALITY: You and/or your care partner have signed paperwork which will be entered into your electronic medical record.  These signatures attest to the fact that that the information above on your After Visit Summary has been reviewed and is understood.  Full responsibility of the  confidentiality of this discharge information lies with you and/or your care-partner. 

## 2020-08-21 NOTE — Progress Notes (Signed)
Called to room to assist during endoscopic procedure.  Patient ID and intended procedure confirmed with present staff. Received instructions for my participation in the procedure from the performing physician.  

## 2020-08-25 ENCOUNTER — Other Ambulatory Visit: Payer: Self-pay | Admitting: Adult Health

## 2020-08-25 ENCOUNTER — Telehealth: Payer: Self-pay

## 2020-08-25 NOTE — Telephone Encounter (Signed)
LVM

## 2020-08-31 ENCOUNTER — Encounter: Payer: Self-pay | Admitting: Gastroenterology

## 2020-10-05 HISTORY — PX: BREAST BIOPSY: SHX20

## 2020-10-10 ENCOUNTER — Other Ambulatory Visit: Payer: Self-pay

## 2020-10-10 ENCOUNTER — Ambulatory Visit
Admission: RE | Admit: 2020-10-10 | Discharge: 2020-10-10 | Disposition: A | Discharge status: Home / Self Care | Payer: 59 | Source: Ambulatory Visit | Attending: Family Medicine | Admitting: Family Medicine

## 2020-10-10 DIAGNOSIS — Z1231 Encounter for screening mammogram for malignant neoplasm of breast: Secondary | ICD-10-CM

## 2020-10-13 ENCOUNTER — Other Ambulatory Visit: Payer: Self-pay | Admitting: Family Medicine

## 2020-10-13 DIAGNOSIS — R928 Other abnormal and inconclusive findings on diagnostic imaging of breast: Secondary | ICD-10-CM

## 2020-10-17 ENCOUNTER — Other Ambulatory Visit: Payer: Self-pay | Admitting: Family Medicine

## 2020-10-17 ENCOUNTER — Ambulatory Visit
Admission: RE | Admit: 2020-10-17 | Discharge: 2020-10-17 | Disposition: A | Discharge status: Home / Self Care | Payer: 59 | Source: Ambulatory Visit | Attending: Family Medicine | Admitting: Family Medicine

## 2020-10-17 ENCOUNTER — Other Ambulatory Visit: Payer: Self-pay

## 2020-10-17 DIAGNOSIS — R928 Other abnormal and inconclusive findings on diagnostic imaging of breast: Secondary | ICD-10-CM

## 2020-10-21 ENCOUNTER — Ambulatory Visit
Admission: RE | Admit: 2020-10-21 | Discharge: 2020-10-21 | Disposition: A | Discharge status: Home / Self Care | Payer: 59 | Source: Ambulatory Visit | Attending: Family Medicine | Admitting: Family Medicine

## 2020-10-21 ENCOUNTER — Other Ambulatory Visit: Payer: Self-pay

## 2020-10-21 DIAGNOSIS — R928 Other abnormal and inconclusive findings on diagnostic imaging of breast: Secondary | ICD-10-CM

## 2020-10-22 ENCOUNTER — Encounter: Payer: Self-pay | Admitting: Adult Health

## 2020-10-22 DIAGNOSIS — D241 Benign neoplasm of right breast: Secondary | ICD-10-CM | POA: Insufficient documentation

## 2020-10-22 HISTORY — DX: Benign neoplasm of right breast: D24.1

## 2020-11-13 ENCOUNTER — Ambulatory Visit: Payer: 59 | Admitting: Adult Health

## 2020-11-24 ENCOUNTER — Ambulatory Visit: Payer: 59 | Admitting: Adult Health

## 2020-11-24 ENCOUNTER — Other Ambulatory Visit: Payer: Self-pay

## 2020-11-24 ENCOUNTER — Encounter: Payer: Self-pay | Admitting: Adult Health

## 2020-11-24 VITALS — BP 125/86 | HR 80 | Ht 63.5 in | Wt 150.0 lb

## 2020-11-24 DIAGNOSIS — N951 Menopausal and female climacteric states: Secondary | ICD-10-CM

## 2020-11-24 DIAGNOSIS — K219 Gastro-esophageal reflux disease without esophagitis: Secondary | ICD-10-CM | POA: Diagnosis not present

## 2020-11-24 DIAGNOSIS — Z8742 Personal history of other diseases of the female genital tract: Secondary | ICD-10-CM

## 2020-11-24 DIAGNOSIS — I10 Essential (primary) hypertension: Secondary | ICD-10-CM

## 2020-11-24 DIAGNOSIS — Z7689 Persons encountering health services in other specified circumstances: Secondary | ICD-10-CM

## 2020-11-24 MED ORDER — PROPRANOLOL HCL 10 MG PO TABS: ORAL_TABLET | ORAL | 6 refills | Status: DC

## 2020-11-24 MED ORDER — PANTOPRAZOLE SODIUM 20 MG PO TBEC: 20.0000 mg | DELAYED_RELEASE_TABLET | Freq: Every day | ORAL | 3 refills | Status: DC

## 2020-11-24 NOTE — Progress Notes (Signed)
  Subjective:     Patient ID: Kristi Medina, female   DOB: [birth year], 45 y.o.   MRN: [id number]  HPI Kristi Medina is a 45 year old white female,divorced, G2P2 in for 6 month follow up on BP and it is better, on 10 mg Inderal. She has some complaints of weight gain,blisters on soft palate, peri oral rash, headaches, hair loss and reflux.  She is Therapist, sports working for [employer]  PCP is Sallee Lange MD.  Review of Systems +weight gain +reflux Has blisters on soft palate, not sure if food related Treated recently for peri oral rash, was been with prednisone  Has had some hair loss Has had headaches lately Not sexually active  Reviewed past medical,surgical, social and family history. Reviewed medications and allergies.     Objective:   Physical Exam BP 125/86 (BP Location: Right Arm, Patient Position: Sitting, Cuff Size: Normal)   Pulse 80   Ht 5' 3.5" (1.613 m)   Wt 150 lb (68 kg)   LMP  (LMP Unknown)   BMI 26.15 kg/m  Skin warm and dry.Lungs: clear to ausculation bilaterally. Cardiovascular: regular rate and rhythm.   Upstream - 11/24/20 0942       Pregnancy Intention Screening   Does the patient want to become pregnant in the next year? No    Does the patient's partner want to become pregnant in the next year? No    Would the patient like to discuss contraceptive options today? No      Contraception Wrap Up   Current Method Female Sterilization    End Method Female Sterilization    Contraception Counseling Provided No                Assessment:     1. Hypertension, unspecified type Will refill inderal  Meds ordered this encounter  Medications   propranolol (INDERAL) 10 MG tablet    Sig: Take 1 daily    Dispense:  30 tablet    Refill:  6    Order Specific Question:   Supervising Provider    Answer:   Tania Ade H [[id number]]   pantoprazole (PROTONIX) 20 MG tablet    Sig: Take 1 tablet (20 mg total) by mouth daily.    Dispense:  90 tablet    Refill:  3    Order  Specific Question:   Supervising Provider    Answer:   Elonda Husky, LUTHER H [[id number]]     2. Encounter for menstrual regulation Given 6 samples of lo loestrin  3. Gastroesophageal reflux disease without esophagitis Will rx Protonix   4. History of abnormal cervical Pap smear Pap in 7 months   5. Peri-menopause Discussed symptoms related to peri menopause     Plan:     Get appt with allergists Pap and physical in 7 months

## 2020-12-19 ENCOUNTER — Encounter: Payer: Self-pay | Admitting: Family Medicine

## 2021-01-12 ENCOUNTER — Telehealth: Payer: Self-pay | Admitting: Adult Health

## 2021-01-12 NOTE — Telephone Encounter (Signed)
Needs referral to Dr Renato Shin endocrinology for follow up US thyroid

## 2021-02-28 ENCOUNTER — Ambulatory Visit (HOSPITAL_COMMUNITY)
Admission: EM | Admit: 2021-02-28 | Discharge: 2021-02-28 | Disposition: A | Discharge status: Home / Self Care | Payer: 59 | Attending: Family Medicine | Admitting: Family Medicine

## 2021-02-28 ENCOUNTER — Other Ambulatory Visit: Payer: Self-pay

## 2021-02-28 ENCOUNTER — Encounter (HOSPITAL_COMMUNITY): Payer: Self-pay | Admitting: Emergency Medicine

## 2021-02-28 DIAGNOSIS — M545 Low back pain, unspecified: Secondary | ICD-10-CM

## 2021-02-28 MED ORDER — DEXAMETHASONE SODIUM PHOSPHATE 10 MG/ML IJ SOLN
INTRAMUSCULAR | Status: AC
  Filled 2021-02-28: qty 1

## 2021-02-28 MED ORDER — KETOROLAC TROMETHAMINE 60 MG/2ML IM SOLN
INTRAMUSCULAR | Status: AC
  Filled 2021-02-28: qty 2

## 2021-02-28 MED ORDER — DEXAMETHASONE SODIUM PHOSPHATE 10 MG/ML IJ SOLN
10.0000 mg | Freq: Once | INTRAMUSCULAR | Status: AC
  Administered 2021-02-28: 10 mg via INTRAMUSCULAR

## 2021-02-28 MED ORDER — KETOROLAC TROMETHAMINE 60 MG/2ML IM SOLN
60.0000 mg | Freq: Once | INTRAMUSCULAR | Status: AC
  Administered 2021-02-28: 60 mg via INTRAMUSCULAR

## 2021-02-28 MED ORDER — HYDROCODONE-ACETAMINOPHEN 5-325 MG PO TABS: 1.0000 | ORAL_TABLET | Freq: Four times a day (QID) | ORAL | 0 refills | Status: DC | PRN

## 2021-02-28 NOTE — ED Triage Notes (Signed)
Pt reports Monday was drying her hair and bent over when came up got bad pains in mid, lower back. Reports got bad again after sweeping.

## 2021-02-28 NOTE — ED Provider Notes (Signed)
[facility]   [id number] 02/28/21 Arrival Time: 7824  ASSESSMENT & PLAN:  1. Acute midline low back pain without sciatica    Without radicular symptoms. Able to ambulate here and hemodynamically stable. No indication for imaging of back at this time given no trauma and normal neurological exam. Discussed.  Meds ordered this encounter  Medications   HYDROcodone-acetaminophen (NORCO/VICODIN) 5-325 MG tablet    Sig: Take 1 tablet by mouth every 6 (six) hours as needed for moderate pain or severe pain.    Dispense:  10 tablet    Refill:  0   dexamethasone (DECADRON) injection 10 mg   ketorolac (TORADOL) injection 60 mg   Medication sedation precautions given. Encourage ROM/movement as tolerated.  Recommend:  Follow-up Information     [place].   Specialty: Emergency Medicine Why: If worsening or failing to improve as anticipated. Contact information: [address] [phone] [place] [phone]        Schedule an appointment as soon as possible for a visit  with Elvia Collum M, DO.   Specialty: Family Medicine Contact information: [place] [address] [phone]                 Reviewed expectations re: course of current medical issues. Questions answered. Outlined signs and symptoms indicating need for more acute intervention. Patient verbalized understanding. After Visit Summary given.   SUBJECTIVE: History from: patient. Kristi Medina is a 45 y.o. female who presents with complaint of intermittent midline low back pain after bending forward; pain over few days; without radiation; 'feels deep'. Trama: none reported. History of back problems requiring medical care: rare. Pain described as aching. Aggravating factors: prolonged sitting. Alleviating factors: have not been identified. Progressive LE weakness or saddle anesthesia: none. Extremity  sensation changes or weakness: none. Ambulatory without difficulty. Normal bowel/bladder habits: yes; without urinary retention. Normal PO intake without n/v. No associated abdominal pain/n/v. Self treatment: has  tried OTC analgesics without relief .   OBJECTIVE:  Vitals:   02/28/21 1142  BP: 123/86  Pulse: 85  Resp: 17  Temp: 98.3 F (36.8 C)  TempSrc: Oral  SpO2: 99%    General appearance: alert; no distress HEENT: [place]; AT Neck: supple with FROM; without midline tenderness CV: regular Lungs: unlabored respirations; speaks full sentences without difficulty Abdomen: soft, non-tender; non-distended Back: mild  and poorly localized tenderness to palpation over midline and slightly laterally over lower back ; FROM at waist; bruising: none; without midline bony tenderness Extremities: with edema; symmetrical without gross deformities; normal ROM of bilateral LE Skin: warm and dry Neurologic: normal gait; normal sensation and strength of bilateral LE Psychological: alert and cooperative; normal mood and affect   No Known Allergies  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   GOITER, MULTINODULAR 06/11/2010   Hypertension    RUQ pain 08/22/2015   THYROIDITIS 06/11/2010   Vaginal Pap smear, abnormal    Social History   Socioeconomic History   Marital status: Divorced    Spouse name: Not on file   Number of children: 2   Years of education: Not on file   Highest education level: Not on file  Occupational History   Occupation: Nurse    Employer: [employer]  Tobacco  Use   Smoking status: Never   Smokeless tobacco: Never  Vaping Use   Vaping Use: Never used  Substance and Sexual Activity   Alcohol use: Not Currently    Comment: rare   Drug use: No   Sexual activity: Not  Currently    Birth control/protection: Surgical, Pill    Comment: ablation and tubal  Other Topics Concern   Not on file  Social History Narrative   Regular exercise-no   Social Determinants of Health   Financial Resource Strain: Low Risk    Difficulty of Paying Living Expenses: Not hard at all  Food Insecurity: No Food Insecurity   Worried About Charity fundraiser in the Last Year: Never true   Ran Out of Food in the Last Year: Never true  Transportation Needs: No Transportation Needs   Lack of Transportation (Medical): No   Lack of Transportation (Non-Medical): No  Physical Activity: Inactive   Days of Exercise per Week: 0 days   Minutes of Exercise per Session: 0 min  Stress: No Stress Concern Present   Feeling of Stress : Only a little  Social Connections: Moderately Integrated   Frequency of Communication with Friends and Family: More than three times a week   Frequency of Social Gatherings with Friends and Family: Once a week   Attends Religious Services: More than 4 times per year   Active Member of Clubs or Organizations: Yes   Attends Music therapist: More than 4 times per year   Marital Status: Divorced  Human resources officer Violence: Not At Risk   Fear of Current or Ex-Partner: No   Emotionally Abused: No   Physically Abused: No   Sexually Abused: No   Family History  Problem Relation Age of Onset   Thyroid disease Brother        had overactive thyroid   Hypertension Mother    Diabetes Father    Stroke Father    Hypertension Father    Colon cancer Maternal Grandmother    Breast cancer Maternal Grandmother    Colon polyps Brother        Pre-cancerous   Goiter Other        Great Aunt   Alcohol abuse Other    Stomach cancer Neg Hx    Pancreatic cancer Neg Hx    Esophageal cancer Neg Hx    Liver disease Neg Hx    Past Surgical History:  Procedure Laterality Date   ENDOMETRIAL ABLATION  2006   for menorrhagia   TUBAL LIGATION  2006       Vanessa Kick, MD 03/02/21 [phone]

## 2021-02-28 NOTE — Discharge Instructions (Signed)

## 2021-06-09 ENCOUNTER — Encounter: Payer: Self-pay | Admitting: Endocrinology

## 2021-06-09 ENCOUNTER — Ambulatory Visit
Admission: RE | Admit: 2021-06-09 | Discharge: 2021-06-09 | Disposition: A | Discharge status: Home / Self Care | Payer: 59 | Source: Ambulatory Visit | Attending: Endocrinology | Admitting: Endocrinology

## 2021-06-09 ENCOUNTER — Other Ambulatory Visit: Payer: Self-pay

## 2021-06-09 ENCOUNTER — Ambulatory Visit: Payer: 59 | Admitting: Endocrinology

## 2021-06-09 VITALS — BP 98/60 | HR 102 | Ht 63.5 in | Wt 155.4 lb

## 2021-06-09 DIAGNOSIS — E042 Nontoxic multinodular goiter: Secondary | ICD-10-CM

## 2021-06-09 LAB — T4, FREE: Free T4: 1.16 ng/dL (ref 0.60–1.60)

## 2021-06-09 LAB — TSH: TSH: 2.52 u[IU]/mL (ref 0.35–5.50)

## 2021-06-09 NOTE — Progress Notes (Signed)
Subjective:    Patient ID: Kristi Medina, female    DOB: February 18, 1976, 46 y.o.   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  she was rx'ed synthroid, but this caused fatigue and tremor, so she stopped it; spirometry was normal; she had bxs of bilat nodules in 2010 (pt says benign, but results are not available; she has since been euthyroid off any rx).  She does not notice the goiter.   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   GOITER, MULTINODULAR 06/11/2010   Hypertension    RUQ pain 08/22/2015   THYROIDITIS 06/11/2010   Vaginal Pap smear, abnormal     Past Surgical History:  Procedure Laterality Date   ENDOMETRIAL ABLATION  2006   for menorrhagia   TUBAL LIGATION  2006    Social History   Socioeconomic History   Marital status: Divorced    Spouse name: Not on file   Number of children: 2   Years of education: Not on file   Highest education level: Not on file  Occupational History   Occupation: Nurse    Employer: [employer]  Tobacco Use   Smoking status: Never   Smokeless tobacco: Never  Vaping Use   Vaping Use: Never used  Substance and Sexual Activity   Alcohol use: Not Currently    Comment: rare   Drug use: No   Sexual activity: Not Currently    Birth control/protection: Surgical, Pill    Comment: ablation and tubal  Other Topics Concern   Not on file  Social History Narrative   Regular exercise-no   Social Determinants of Health   Financial Resource Strain: Low Risk    Difficulty of Paying Living Expenses: Not hard at all  Food Insecurity: No Food Insecurity   Worried About Charity fundraiser in the Last Year: Never true   Arboriculturist in the Last Year: Never true  Transportation Needs: No Transportation Needs    Lack of Transportation (Medical): No   Lack of Transportation (Non-Medical): No  Physical Activity: Inactive   Days of Exercise per Week: 0 days   Minutes of Exercise per Session: 0 min  Stress: No Stress Concern Present   Feeling of Stress : Only a little  Social Connections: Moderately Integrated   Frequency of Communication with Friends and Family: More than three times a week   Frequency of Social Gatherings with Friends and Family: Once a week   Attends Religious Services: More than 4 times per year   Active Member of [employer] or Organizations: Yes   Attends Music therapist: More than 4 times per year   Marital Status: Divorced  Human resources officer Violence: Not At Risk   Fear of Current or Ex-Partner: No   Emotionally Abused: No   Physically Abused: No   Sexually Abused: No    Current Outpatient Medications on File Prior to Visit  Medication Sig Dispense Refill   LO LOESTRIN FE 1 MG-10 MCG / 10 MCG tablet Take 1 tablet by mouth once daily 28 tablet 12   LORazepam (ATIVAN) 0.5 MG tablet TAKE 1/2 TO 1 (ONE-HALF TO ONE) TABLET BY MOUTH EVERY 8 HOURS AS NEEDED FOR ANXIETY 30 tablet 0   pantoprazole (PROTONIX) 20  MG tablet Take 1 tablet (20 mg total) by mouth daily. 90 tablet 3   propranolol (INDERAL) 10 MG tablet Take 1 daily 30 tablet 6   No current facility-administered medications on file prior to visit.    No Known Allergies  Family History  Problem Relation Age of Onset   Thyroid disease Brother        had overactive thyroid   Hypertension Mother    Diabetes Father    Stroke Father    Hypertension Father    Colon cancer Maternal Grandmother    Breast cancer Maternal Grandmother    Colon polyps Brother        Pre-cancerous   Goiter Other        Great Aunt   Alcohol abuse Other    Stomach cancer Neg Hx    Pancreatic cancer Neg Hx    Esophageal cancer Neg Hx    Liver disease Neg Hx     BP 98/60 (BP Location: Left Arm, Patient Position: Sitting, Cuff  Size: Normal)    Pulse (!) 102    Ht 5' 3.5" (1.613 m)    Wt 155 lb 6.4 oz (70.5 kg)    SpO2 99%    BMI 27.10 kg/m    Review of Systems she has 6 mos of fatigue.      Objective:   Physical Exam VITAL SIGNS:  See vs page GENERAL: no distress NECK: Thyroid is approx twice normal size, with irreg surface.  There is a 1-2 cm left sided nodule.  No palp LN's.     I have reviewed outside records, and summarized: Pt was noted to have MNG, and referred here.  Pt was last seen in 2019.  She was euthyroid, but was at risk for abnormal TFT, due to MNG and pos antibodies.    [place] (06/09/21): low risk for malignancy     Assessment & Plan:  MNG: stable but she is at risk for abnormal TFT.  Check TFT today.   Please come back for a follow-up appointment in 1-2 years.

## 2021-06-09 NOTE — Patient Instructions (Signed)
Let's recheck the ultrasound.  you will receive a phone call, about a day and time for an appointment.  Blood tests are requested for you today.  We'll let you know about the results.  Please come back for a follow-up appointment in 1-2 years.

## 2021-06-10 ENCOUNTER — Other Ambulatory Visit: Payer: 59

## 2021-06-18 ENCOUNTER — Ambulatory Visit: Payer: 59 | Admitting: Nurse Practitioner

## 2021-06-18 ENCOUNTER — Other Ambulatory Visit: Payer: Self-pay

## 2021-06-18 ENCOUNTER — Encounter: Payer: Self-pay | Admitting: Nurse Practitioner

## 2021-06-18 VITALS — BP 120/76 | HR 66 | Temp 98.4°F | Ht 63.5 in | Wt 151.2 lb

## 2021-06-18 DIAGNOSIS — R197 Diarrhea, unspecified: Secondary | ICD-10-CM | POA: Diagnosis not present

## 2021-06-18 MED ORDER — ONDANSETRON HCL 4 MG PO TABS: 4.0000 mg | ORAL_TABLET | Freq: Three times a day (TID) | ORAL | 0 refills | Status: DC | PRN

## 2021-06-18 NOTE — Progress Notes (Signed)
° °  Subjective:    Patient ID: Kristi Medina, female    DOB: 10-25-1975, 46 y.o.   MRN: [id number]  HPI  Patient is here for nausea, early satiety, and 3 episodes of vomiting that started about 3-4 weeks ago. Patient also states that started having watery diarrhea x5 days ago after eating a salad on Sunday. Patient states that she experienced diarrhea all day on Monday ~6x, a few times on Tuesday with the urge to go multiple times between 2am and 3:30 am. On Wednesday diarrhea had stopped. Patient report feeling bloated on Wednesday but had no episodes of diarrhea.  No diarrhea currently. Patient thinks it could be gallbladder related. Patient admits to symptom association with greasy foods, hx of acid reflux, belching, flatulence, hx of constipation, and mild left sided cramping. Patient denies fever, blood in stool, chills, body aches, weight loss, abd pain.   Review of Systems  Gastrointestinal:  Positive for diarrhea.  All other systems reviewed and are negative.     Objective:   Physical Exam Constitutional:      General: She is in acute distress.     Appearance: Normal appearance. She is not ill-appearing or toxic-appearing.  HENT:     Head: Normocephalic.  Cardiovascular:     Rate and Rhythm: Normal rate and regular rhythm.     Pulses: Normal pulses.     Heart sounds: Normal heart sounds. No murmur heard.   No gallop.  Pulmonary:     Effort: Pulmonary effort is normal. No respiratory distress.     Breath sounds: Normal breath sounds. No wheezing.  Abdominal:     General: Abdomen is flat. Bowel sounds are normal.     Palpations: Abdomen is soft. There is no shifting dullness, fluid wave, hepatomegaly, splenomegaly, mass or pulsatile mass.     Tenderness: There is no abdominal tenderness. There is no guarding or rebound. Negative signs include Murphy's sign, Rovsing's sign, McBurney's sign, psoas sign and obturator sign.     Hernia: No hernia is present.  Musculoskeletal:      Cervical back: Normal range of motion and neck supple.  Neurological:     Mental Status: She is alert.          Assessment & Plan:   1. Diarrhea, unspecified type - Etiology unclear at this time - Will evaluate pancreatic and gall bladder etiologies - Infectious etiologies not likely as this time especially since diarrhea has seemed to have resolved and due to lack of systemic symptoms.  - Possible IBS??? - Patient on birth control and has hx of endometrial ablation. Pregnancy not suspected at this time.  - CBC with Differential - CMP14+EGFR - Lipase - Amylase - ondansetron (ZOFRAN) 4 MG tablet; Take 1 tablet (4 mg total) by mouth every 8 (eight) hours as needed for nausea or vomiting.  Dispense: 20 tablet; Refill: 0 - RTC if diarrhea returns or for any new symptoms - Will evaluate for IBS if symptoms return.

## 2021-06-19 LAB — CMP14+EGFR
ALT: 29 IU/L (ref 0–32)
AST: 24 IU/L (ref 0–40)
Albumin/Globulin Ratio: 1.6 (ref 1.2–2.2)
Albumin: 4.5 g/dL (ref 3.8–4.8)
Alkaline Phosphatase: 56 IU/L (ref 44–121)
BUN/Creatinine Ratio: 15 (ref 9–23)
BUN: 17 mg/dL (ref 6–24)
Bilirubin Total: 0.4 mg/dL (ref 0.0–1.2)
CO2: 20 mmol/L (ref 20–29)
Calcium: 9.5 mg/dL (ref 8.7–10.2)
Chloride: 104 mmol/L (ref 96–106)
Creatinine, Ser: 1.13 mg/dL — ABNORMAL HIGH (ref 0.57–1.00)
Globulin, Total: 2.9 g/dL (ref 1.5–4.5)
Glucose: 81 mg/dL (ref 70–99)
Potassium: 4.4 mmol/L (ref 3.5–5.2)
Sodium: 141 mmol/L (ref 134–144)
Total Protein: 7.4 g/dL (ref 6.0–8.5)
eGFR: 61 mL/min/{1.73_m2} (ref >59)

## 2021-06-19 LAB — CBC WITH DIFFERENTIAL/PLATELET
Basophils Absolute: 0 10*3/uL (ref 0.0–0.2)
Basos: 0 %
EOS (ABSOLUTE): 0.2 10*3/uL (ref 0.0–0.4)
Eos: 2 %
Hematocrit: 46.5 % (ref 34.0–46.6)
Hemoglobin: 15.7 g/dL (ref 11.1–15.9)
Immature Grans (Abs): 0 10*3/uL (ref 0.0–0.1)
Immature Granulocytes: 0 %
Lymphocytes Absolute: 2.2 10*3/uL (ref 0.7–3.1)
Lymphs: 27 %
MCH: 30.6 pg (ref 26.6–33.0)
MCHC: 33.8 g/dL (ref 31.5–35.7)
MCV: 91 fL (ref 79–97)
Monocytes Absolute: 0.5 10*3/uL (ref 0.1–0.9)
Monocytes: 7 %
Neutrophils Absolute: 5 10*3/uL (ref 1.4–7.0)
Neutrophils: 64 %
Platelets: 273 10*3/uL (ref 150–450)
RBC: 5.13 x10E6/uL (ref 3.77–5.28)
RDW: 12.5 % (ref 11.7–15.4)
WBC: 7.9 10*3/uL (ref 3.4–10.8)

## 2021-06-19 LAB — AMYLASE: Amylase: 69 U/L (ref 31–110)

## 2021-06-19 LAB — LIPASE: Lipase: 47 U/L (ref 14–72)

## 2021-06-19 NOTE — Progress Notes (Signed)
Hi [place]!  Your lab work did not give me many clues to why you were experiencing diarrhea. Your liver enzymes look good, kidney labs within normal limits, Lipase and amylase within normal limits. Please let us know if your diarrhea returns and we can continue to investigate. I hope you are feeling better.   Barbee Shropshire

## 2021-06-25 ENCOUNTER — Other Ambulatory Visit: Payer: Self-pay | Admitting: Adult Health

## 2021-06-30 ENCOUNTER — Encounter: Payer: Self-pay | Admitting: Adult Health

## 2021-06-30 ENCOUNTER — Other Ambulatory Visit: Payer: Self-pay

## 2021-06-30 ENCOUNTER — Ambulatory Visit (INDEPENDENT_AMBULATORY_CARE_PROVIDER_SITE_OTHER): Payer: 59 | Admitting: Adult Health

## 2021-06-30 ENCOUNTER — Other Ambulatory Visit (HOSPITAL_COMMUNITY)
Admission: RE | Admit: 2021-06-30 | Discharge: 2021-06-30 | Disposition: A | Discharge status: Home / Self Care | Payer: 59 | Source: Ambulatory Visit | Attending: Adult Health | Admitting: Adult Health

## 2021-06-30 VITALS — BP 117/80 | HR 75 | Ht 63.0 in | Wt 154.0 lb

## 2021-06-30 DIAGNOSIS — N951 Menopausal and female climacteric states: Secondary | ICD-10-CM

## 2021-06-30 DIAGNOSIS — Z1211 Encounter for screening for malignant neoplasm of colon: Secondary | ICD-10-CM

## 2021-06-30 DIAGNOSIS — I10 Essential (primary) hypertension: Secondary | ICD-10-CM | POA: Diagnosis not present

## 2021-06-30 DIAGNOSIS — Z01419 Encounter for gynecological examination (general) (routine) without abnormal findings: Secondary | ICD-10-CM | POA: Diagnosis present

## 2021-06-30 DIAGNOSIS — E049 Nontoxic goiter, unspecified: Secondary | ICD-10-CM | POA: Insufficient documentation

## 2021-06-30 LAB — HEMOCCULT GUIAC POC 1CARD (OFFICE): Fecal Occult Blood, POC: NEGATIVE

## 2021-06-30 NOTE — Progress Notes (Signed)
Patient ID: Kristi Medina, female   DOB: 1975-11-14, 46 y.o.   MRN: [id number] History of Present Illness: Kristi Medina is a 46 year old white female, divorced, G2P2 in for a well woman gyn exam and pap. PCP is Dr Lacinda Axon    Current Medications, Allergies, Past Medical History, Past Surgical History, Family History and Social History were reviewed in [facility] record.     Review of Systems:  Patient denies any headaches, hearing loss, fatigue, blurred vision, shortness of breath, chest pain, abdominal pain, problems with bowel movements, urination, or intercourse.(Not having sex). No joint pain or mood swings.  Wakes up at night She is on lo Loestrin to stop periods, ablation did not work.  Physical Exam:BP 117/80 (BP Location: Left Arm, Patient Position: Sitting, Cuff Size: Normal)    Pulse 75    Ht 5\' 3"  (1.6 m)    Wt 154 lb (69.9 kg)    LMP  (LMP Unknown)    BMI 27.28 kg/m   General:  Well developed, well nourished, no acute distress Skin:  Warm and dry Neck:  Midline trachea, thyroid,+goiter, good ROM, no lymphadenopathy Lungs; Clear to auscultation bilaterally Breast:  No dominant palpable mass, retraction, or nipple discharge on the left,on right has know fibroadenoma at about 9 0' clock, no retraction or nipple discharge.  Cardiovascular: Regular rate and rhythm Abdomen:  Soft, non tender, no hepatosplenomegaly Pelvic:  External genitalia is normal in appearance, no lesions.  The vagina is normal in appearance. Urethra has no lesions or masses. The cervix is bulbous.Pap with HR HPV genotyping performed.  Uterus is felt to be normal size, shape, and contour.  No adnexal masses or tenderness noted.Bladder is non tender, no masses felt. Rectal: Good sphincter tone, no polyps, or hemorrhoids felt.  Hemoccult negative. Extremities/musculoskeletal:  No swelling or varicosities noted, no clubbing or cyanosis Psych:  No mood changes, alert and cooperative,seems happy AA is  0 Fall risk is low Depression screen [facility] 2/9 06/30/2021 06/18/2020 06/18/2019  Decreased Interest 0 0 0  Down, Depressed, Hopeless 0 0 0  PHQ - 2 Score 0 0 0  Altered sleeping 2 1 -  Tired, decreased energy 1 0 -  Change in appetite 0 0 -  Feeling bad or failure about yourself  0 0 -  Trouble concentrating 0 0 -  Moving slowly or fidgety/restless 0 0 -  Suicidal thoughts 0 0 -  PHQ-9 Score 3 1 -    GAD 7 : Generalized Anxiety Score 06/30/2021 06/18/2020  Nervous, Anxious, on Edge 0 0  Control/stop worrying 0 0  Worry too much - different things 0 0  Trouble relaxing 0 0  Restless 0 0  Easily annoyed or irritable 0 0  Afraid - awful might happen 0 0  Total GAD 7 Score 0 0      Upstream - 06/30/21 1538       Pregnancy Intention Screening   Does the patient want to become pregnant in the next year? N/A    Does the patient's partner want to become pregnant in the next year? N/A    Would the patient like to discuss contraceptive options today? N/A      Contraception Wrap Up   Current Method Female Sterilization    End Method Female Sterilization    Contraception Counseling Provided No            Examination chaperoned by Marcelino Scot RN   Impression and Plan: 1. Encounter for gynecological  examination with Papanicolaou smear of cervix Pap sent Physical in 1 year Pap in 3 if normal Mammogram yearly  Colonoscopy per GI in 2025.   2. Encounter for screening fecal occult blood testing   3. Peri-menopause Continue lo Loestrin, 6 samples given   4. Hypertension, unspecified type On inderal has refills   5. Goiter Sees Dr Loanne Drilling

## 2021-07-03 LAB — CYTOLOGY - PAP
Comment: NEGATIVE
Diagnosis: NEGATIVE
Diagnosis: REACTIVE
High risk HPV: NEGATIVE

## 2021-09-25 ENCOUNTER — Other Ambulatory Visit: Payer: Self-pay | Admitting: Family Medicine

## 2021-09-25 DIAGNOSIS — Z1231 Encounter for screening mammogram for malignant neoplasm of breast: Secondary | ICD-10-CM

## 2021-10-12 ENCOUNTER — Ambulatory Visit
Admission: RE | Admit: 2021-10-12 | Discharge: 2021-10-12 | Disposition: A | Discharge status: Home / Self Care | Payer: 59 | Source: Ambulatory Visit | Attending: Family Medicine | Admitting: Family Medicine

## 2021-10-12 ENCOUNTER — Ambulatory Visit: Payer: 59

## 2021-10-12 DIAGNOSIS — Z1231 Encounter for screening mammogram for malignant neoplasm of breast: Secondary | ICD-10-CM

## 2021-11-26 ENCOUNTER — Other Ambulatory Visit: Payer: Self-pay | Admitting: Adult Health

## 2022-06-16 ENCOUNTER — Other Ambulatory Visit: Payer: Self-pay | Admitting: Adult Health

## 2022-06-22 ENCOUNTER — Ambulatory Visit: Payer: 59 | Admitting: Endocrinology

## 2022-06-28 ENCOUNTER — Other Ambulatory Visit: Payer: Self-pay | Admitting: Adult Health

## 2022-06-28 MED ORDER — LO LOESTRIN FE 1 MG-10 MCG / 10 MCG PO TABS: 1.0000 | ORAL_TABLET | Freq: Every day | ORAL | 12 refills | Status: DC

## 2022-06-28 NOTE — Progress Notes (Signed)
Refill lo Loestrin

## 2022-07-02 ENCOUNTER — Ambulatory Visit: Payer: 59 | Admitting: Adult Health

## 2022-07-14 ENCOUNTER — Ambulatory Visit (INDEPENDENT_AMBULATORY_CARE_PROVIDER_SITE_OTHER): Payer: 59 | Admitting: Adult Health

## 2022-07-14 ENCOUNTER — Encounter: Payer: Self-pay | Admitting: Adult Health

## 2022-07-14 VITALS — BP 130/84 | HR 71 | Ht 63.0 in | Wt 158.0 lb

## 2022-07-14 DIAGNOSIS — Z13220 Encounter for screening for lipoid disorders: Secondary | ICD-10-CM

## 2022-07-14 DIAGNOSIS — R319 Hematuria, unspecified: Secondary | ICD-10-CM

## 2022-07-14 DIAGNOSIS — Z1211 Encounter for screening for malignant neoplasm of colon: Secondary | ICD-10-CM | POA: Diagnosis not present

## 2022-07-14 DIAGNOSIS — N951 Menopausal and female climacteric states: Secondary | ICD-10-CM | POA: Diagnosis not present

## 2022-07-14 DIAGNOSIS — E049 Nontoxic goiter, unspecified: Secondary | ICD-10-CM

## 2022-07-14 DIAGNOSIS — R195 Other fecal abnormalities: Secondary | ICD-10-CM | POA: Insufficient documentation

## 2022-07-14 DIAGNOSIS — Z01419 Encounter for gynecological examination (general) (routine) without abnormal findings: Secondary | ICD-10-CM | POA: Insufficient documentation

## 2022-07-14 DIAGNOSIS — R109 Unspecified abdominal pain: Secondary | ICD-10-CM | POA: Insufficient documentation

## 2022-07-14 DIAGNOSIS — Z7689 Persons encountering health services in other specified circumstances: Secondary | ICD-10-CM

## 2022-07-14 DIAGNOSIS — R7989 Other specified abnormal findings of blood chemistry: Secondary | ICD-10-CM

## 2022-07-14 LAB — POCT URINALYSIS DIPSTICK
Glucose, UA: NEGATIVE
Ketones, UA: NEGATIVE
Leukocytes, UA: NEGATIVE
Nitrite, UA: NEGATIVE
Protein, UA: NEGATIVE

## 2022-07-14 LAB — HEMOCCULT GUIAC POC 1CARD (OFFICE): Fecal Occult Blood, POC: NEGATIVE

## 2022-07-14 MED ORDER — PANTOPRAZOLE SODIUM 20 MG PO TBEC: 20.0000 mg | DELAYED_RELEASE_TABLET | Freq: Every day | ORAL | 6 refills | Status: DC

## 2022-07-14 MED ORDER — PROPRANOLOL HCL 10 MG PO TABS: 10.0000 mg | ORAL_TABLET | Freq: Every day | ORAL | 6 refills | Status: DC

## 2022-07-14 NOTE — Progress Notes (Signed)
Patient ID: Kristi Medina, female   DOB: 1975/06/26, 47 y.o.   MRN: [id number] History of Present Illness: Kristi Medina is a 47 year old white female,divorced, G2P2002, in for a well woman gyn exam. She had stomach pain and pain with eating and watery diarrhea to fluffy stool about 2 weeks ago.  Last pap was negative HPV,NILM 06/30/21.  PCP is Dr Lacinda Axon    Current Medications, Allergies, Past Medical History, Past Surgical History, Family History and Social History were reviewed in [place] record.     Review of Systems: Patient denies any headaches, hearing loss, fatigue, blurred vision, shortness of breath, chest pain, abdominal pain, problems with  urination, or intercourse.(Not active).  No joint pain or mood swings.  See HPI for positives.  Physical Exam:BP 130/84 (BP Location: Left Arm, Patient Position: Sitting, Cuff Size: Normal)   Pulse 71   Ht '5\' 3"'$  (1.6 m)   Wt 158 lb (71.7 kg)   BMI 27.99 kg/m  urine large blood. General:  Well developed, well nourished, no acute distress Skin:  Warm and dry Neck:  Midline trachea, enlarge thyroid, (known goiter), good ROM, no lymphadenopathy Lungs; Clear to auscultation bilaterally Breast:  No dominant palpable mass, retraction, or nipple discharge Cardiovascular: Regular rate and rhythm Abdomen:  Soft, non tender, no hepatosplenomegaly Pelvic:  External genitalia is normal in appearance, no lesions.  The vagina is normal in appearance. Urethra has no lesions or masses. The cervix is bulbous.  Uterus is felt to be normal size, shape, and contour.  No adnexal masses or tenderness noted.Bladder is mildly tender, no masses felt. Rectal: Good sphincter tone, no polyps, + hemorrhoids felt.  Hemoccult negative. Extremities/musculoskeletal:  No swelling or varicosities noted, no clubbing or cyanosis Psych:  No mood changes, alert and cooperative,seems happy AA is 0 Fall risk is low    07/14/2022   10:27 AM 06/30/2021    3:39 PM  06/18/2020    9:33 AM  Depression screen PHQ 2/9  Decreased Interest 0 0 0  Down, Depressed, Hopeless 0 0 0  PHQ - 2 Score 0 0 0  Altered sleeping 0 2 1  Tired, decreased energy 1 1 0  Change in appetite 0 0 0  Feeling bad or failure about yourself  0 0 0  Trouble concentrating 0 0 0  Moving slowly or fidgety/restless 0 0 0  Suicidal thoughts 0 0 0  PHQ-9 Score '1 3 1       '$ 07/14/2022   10:27 AM 06/30/2021    3:39 PM 06/18/2020    9:33 AM  GAD 7 : Generalized Anxiety Score  Nervous, Anxious, on Edge 1 0 0  Control/stop worrying 0 0 0  Worry too much - different things 0 0 0  Trouble relaxing 0 0 0  Restless 0 0 0  Easily annoyed or irritable 0 0 0  Afraid - awful might happen 0 0 0  Total GAD 7 Score 1 0 0    Upstream - 07/14/22 1040       Pregnancy Intention Screening   Does the patient want to become pregnant in the next year? No    Does the patient's partner want to become pregnant in the next year? No    Would the patient like to discuss contraceptive options today? No      Contraception Wrap Up   Current Method Female Sterilization    End Method Female Sterilization    Contraception Counseling Provided No  Examination chaperoned by Levy Pupa LPN  Impression and Plan: 1. Hematuria, unspecified type UA C&S sent to rule out UTI - Urine Culture - Urinalysis, Routine w reflex microscopic - POCT Urinalysis Dipstick  2. Encounter for well woman exam with routine gynecological exam Pap in 2026 Physical in 1 year Will check labs - CBC w/Diff - Comprehensive metabolic panel - Lipid panel Mammogram due in May   3. Encounter for screening fecal occult blood testing Hemoccult was negative  - POCT occult blood stool  4. Peri-menopause  5. Goiter  6. Encounter for menstrual regulation Continue lo loestrin, has refills   7. Change in consistency of stool Colonoscopy due 2025  8. Stomach pain Will check labs - Lipase - Amylase  9.  Elevated serum creatinine - Comprehensive metabolic panel  10. Screening cholesterol level - Lipid panel

## 2022-07-15 LAB — COMPREHENSIVE METABOLIC PANEL
ALT: 25 IU/L (ref 0–32)
AST: 21 IU/L (ref 0–40)
Albumin/Globulin Ratio: 1.8 (ref 1.2–2.2)
Albumin: 4.4 g/dL (ref 3.9–4.9)
Alkaline Phosphatase: 58 IU/L (ref 44–121)
BUN/Creatinine Ratio: 11 (ref 9–23)
BUN: 12 mg/dL (ref 6–24)
Bilirubin Total: 0.3 mg/dL (ref 0.0–1.2)
CO2: 19 mmol/L — ABNORMAL LOW (ref 20–29)
Calcium: 9.4 mg/dL (ref 8.7–10.2)
Chloride: 106 mmol/L (ref 96–106)
Creatinine, Ser: 1.08 mg/dL — ABNORMAL HIGH (ref 0.57–1.00)
Globulin, Total: 2.4 g/dL (ref 1.5–4.5)
Glucose: 78 mg/dL (ref 70–99)
Potassium: 4.2 mmol/L (ref 3.5–5.2)
Sodium: 142 mmol/L (ref 134–144)
Total Protein: 6.8 g/dL (ref 6.0–8.5)
eGFR: 64 mL/min/{1.73_m2} (ref >59)

## 2022-07-15 LAB — CBC WITH DIFFERENTIAL/PLATELET
Basophils Absolute: 0 10*3/uL (ref 0.0–0.2)
Basos: 0 %
EOS (ABSOLUTE): 0.1 10*3/uL (ref 0.0–0.4)
Eos: 2 %
Hematocrit: 42.3 % (ref 34.0–46.6)
Hemoglobin: 14.3 g/dL (ref 11.1–15.9)
Immature Grans (Abs): 0 10*3/uL (ref 0.0–0.1)
Immature Granulocytes: 0 %
Lymphocytes Absolute: 2.3 10*3/uL (ref 0.7–3.1)
Lymphs: 32 %
MCH: 30.6 pg (ref 26.6–33.0)
MCHC: 33.8 g/dL (ref 31.5–35.7)
MCV: 91 fL (ref 79–97)
Monocytes Absolute: 0.4 10*3/uL (ref 0.1–0.9)
Monocytes: 6 %
Neutrophils Absolute: 4.3 10*3/uL (ref 1.4–7.0)
Neutrophils: 60 %
Platelets: 319 10*3/uL (ref 150–450)
RBC: 4.67 x10E6/uL (ref 3.77–5.28)
RDW: 12.1 % (ref 11.7–15.4)
WBC: 7.1 10*3/uL (ref 3.4–10.8)

## 2022-07-15 LAB — URINALYSIS, ROUTINE W REFLEX MICROSCOPIC
Bilirubin, UA: NEGATIVE
Glucose, UA: NEGATIVE
Leukocytes,UA: NEGATIVE
Nitrite, UA: NEGATIVE
Protein,UA: NEGATIVE
Specific Gravity, UA: 1.018 (ref 1.005–1.030)
Urobilinogen, Ur: 0.2 mg/dL (ref 0.2–1.0)
pH, UA: 5.5 (ref 5.0–7.5)

## 2022-07-15 LAB — MICROSCOPIC EXAMINATION
Bacteria, UA: NONE SEEN
Casts: NONE SEEN /lpf
WBC, UA: NONE SEEN /hpf (ref 0–5)

## 2022-07-15 LAB — LIPID PANEL
Chol/HDL Ratio: 3.2 ratio (ref 0.0–4.4)
Cholesterol, Total: 154 mg/dL (ref 100–199)
HDL: 48 mg/dL (ref >39)
LDL Chol Calc (NIH): 80 mg/dL (ref 0–99)
Triglycerides: 153 mg/dL — ABNORMAL HIGH (ref 0–149)
VLDL Cholesterol Cal: 26 mg/dL (ref 5–40)

## 2022-07-15 LAB — LIPASE: Lipase: 45 U/L (ref 14–72)

## 2022-07-15 LAB — AMYLASE: Amylase: 65 U/L (ref 31–110)

## 2022-07-16 LAB — URINE CULTURE: Organism ID, Bacteria: NO GROWTH

## 2022-09-06 ENCOUNTER — Ambulatory Visit: Payer: 59 | Admitting: Adult Health

## 2022-09-06 ENCOUNTER — Encounter: Payer: Self-pay | Admitting: Adult Health

## 2022-09-06 VITALS — BP 127/78 | HR 74 | Ht 63.5 in | Wt 156.5 lb

## 2022-09-06 DIAGNOSIS — R102 Pelvic and perineal pain: Secondary | ICD-10-CM | POA: Diagnosis not present

## 2022-09-06 NOTE — Progress Notes (Signed)
  Subjective:     Patient ID: Kristi Medina, female   DOB: 05/15/1976, 47 y.o.   MRN: [id number]  HPI Kristi Medina is a 47 year old white female,divorced, G2P2002, in complaining of pelvic pain, that goes and comes. It hurts to sit at times and when have BM. She is not sexually active.  Last pap was negative HPV, NILM 06/30/21.  PCP is Dr Lacinda Axon.  Review of Systems +pelvic pain that comes and goes Hurts to sit at times and when having BM if strains Denies any problems with uriantion She is not sexually active Reviewed past medical,surgical, social and family history. Reviewed medications and allergies.     Objective:   Physical Exam BP 127/78 (BP Location: Left Arm, Patient Position: Sitting, Cuff Size: Normal)   Pulse 74   Ht 5' 3.5" (1.613 m)   Wt 156 lb 8 oz (71 kg)   BMI 27.29 kg/m     Skin warm and dry.Pelvic: external genitalia is normal in appearance no lesions, vagina: white discharge with odor,urethra has no lesions or masses noted, cervix:smooth and bulbous, uterus: normal size, shape and contour, non tender, no masses felt, adnexa: no masses or tenderness noted. Bladder is non tender and no masses felt. Wet prep: + for clue cells and +WBCs. GC/CHL obtained.   Assessment:     1. Pelvic pain +pelvic pain that comes and goes Will get pelvic US 09/08/22 in office, talk when results back  - US PELVIC COMPLETE WITH TRANSVAGINAL; Future     Plan:     Return in 2 days for [place]

## 2022-09-08 ENCOUNTER — Ambulatory Visit (INDEPENDENT_AMBULATORY_CARE_PROVIDER_SITE_OTHER): Payer: 59

## 2022-09-08 DIAGNOSIS — R102 Pelvic and perineal pain: Secondary | ICD-10-CM | POA: Diagnosis not present

## 2022-09-08 NOTE — Progress Notes (Signed)
PELVIC US TA/TV: heterogeneous anteverted uterus, EEC 1.8 mm,normal ovaries,ovaries appear mobile,no free fluid,left adnexal pain during ultrasound  Chaperone Kelia

## 2022-09-29 ENCOUNTER — Other Ambulatory Visit: Payer: Self-pay | Admitting: Adult Health

## 2022-09-29 DIAGNOSIS — Z1231 Encounter for screening mammogram for malignant neoplasm of breast: Secondary | ICD-10-CM

## 2022-10-14 DIAGNOSIS — Z1231 Encounter for screening mammogram for malignant neoplasm of breast: Secondary | ICD-10-CM

## 2022-10-25 ENCOUNTER — Ambulatory Visit
Admission: RE | Admit: 2022-10-25 | Discharge: 2022-10-25 | Disposition: A | Discharge status: Home / Self Care | Payer: 59 | Source: Ambulatory Visit

## 2022-10-25 DIAGNOSIS — Z1231 Encounter for screening mammogram for malignant neoplasm of breast: Secondary | ICD-10-CM

## 2022-10-26 ENCOUNTER — Other Ambulatory Visit: Payer: Self-pay | Admitting: Adult Health

## 2022-10-26 DIAGNOSIS — R928 Other abnormal and inconclusive findings on diagnostic imaging of breast: Secondary | ICD-10-CM

## 2022-11-05 ENCOUNTER — Other Ambulatory Visit: Payer: Self-pay | Admitting: Adult Health

## 2022-11-05 ENCOUNTER — Ambulatory Visit
Admission: RE | Admit: 2022-11-05 | Discharge: 2022-11-05 | Disposition: A | Discharge status: Home / Self Care | Payer: 59 | Source: Ambulatory Visit | Attending: Adult Health | Admitting: Adult Health

## 2022-11-05 DIAGNOSIS — R928 Other abnormal and inconclusive findings on diagnostic imaging of breast: Secondary | ICD-10-CM

## 2022-11-05 DIAGNOSIS — R921 Mammographic calcification found on diagnostic imaging of breast: Secondary | ICD-10-CM

## 2022-11-10 ENCOUNTER — Ambulatory Visit
Admission: RE | Admit: 2022-11-10 | Discharge: 2022-11-10 | Disposition: A | Discharge status: Home / Self Care | Payer: 59 | Source: Ambulatory Visit | Attending: Adult Health | Admitting: Adult Health

## 2022-11-10 DIAGNOSIS — R921 Mammographic calcification found on diagnostic imaging of breast: Secondary | ICD-10-CM

## 2022-11-10 DIAGNOSIS — R928 Other abnormal and inconclusive findings on diagnostic imaging of breast: Secondary | ICD-10-CM

## 2022-11-10 HISTORY — PX: BREAST BIOPSY: SHX20

## 2023-01-04 ENCOUNTER — Encounter: Payer: Self-pay | Admitting: Physician Assistant

## 2023-01-04 ENCOUNTER — Ambulatory Visit: Payer: 59 | Admitting: Physician Assistant

## 2023-01-04 VITALS — BP 118/80 | HR 80 | Ht 63.0 in | Wt 162.1 lb

## 2023-01-04 DIAGNOSIS — R1032 Left lower quadrant pain: Secondary | ICD-10-CM | POA: Diagnosis not present

## 2023-01-04 DIAGNOSIS — K5909 Other constipation: Secondary | ICD-10-CM | POA: Diagnosis not present

## 2023-01-04 DIAGNOSIS — Z86010 Personal history of colonic polyps: Secondary | ICD-10-CM

## 2023-01-04 NOTE — Progress Notes (Signed)
Chief Complaint: Left lower quadrant pain  HPI:    Kristi Medina is a 47 year old Caucasian female with a past medical history as listed below including chronic constipation known to Dr. Myrtie Neither, who presents to clinic today with left lower quadrant pain.    08/21/2020 colonoscopy done for constipation and change in stool caliber with one 15 x 3 mm polyp at the ileocecal valve removed piecemeal and diverticulosis in the left and right colon.  At that time discussed years of constipation that was thought partially related to pelvic floor dysfunction (birth trauma).  At that time continued on MiraLAX.  Pathology showed tubular adenoma.  Repeat colonoscopy recommended in 3 years.    09/08/2022 pelvic ultrasound was normal.    Today, patient presents to clinic and tells me that she has had 3 episodes since the beginning of the year of left lower quadrant pain that seems to radiate across into her pelvis.  Typically this can last up to 7 days and is really just a dull tenderness that even is worse when she sits down.  Describes that she has tried enemas and suppositories during these episodes but it still remains tender even after having a bowel movement.  Typically she uses MiraLAX maybe 2 to 3 days out of the week and fiber tablets every day in order to help with her chronic constipation, but still has issues.  She is nervous to use the MiraLAX daily.  Describes seeing her OB/GYN for a pelvic ultrasound during an episode of this pain in March but it was normal.  Currently not having any pain.    Denies fever, chills, weight loss, blood in her stool, nausea or vomiting.     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   GOITER, MULTINODULAR 06/11/2010   Hypertension    RUQ pain 08/22/2015   THYROIDITIS 06/11/2010    Vaginal Pap smear, abnormal     Past Surgical History:  Procedure Laterality Date   BREAST BIOPSY Right 10/2020   BREAST BIOPSY Right 11/10/2022   MM RT BREAST BX W LOC DEV 1ST LESION IMAGE BX SPEC STEREO GUIDE 11/10/2022 [facility] M[facility]MO[facility]   ENDOMETRIAL ABLATION  06/07/2004   for menorrhagia   TUBAL LIGATION  06/07/2004    Current Outpatient Medications  Medication Sig Dispense Refill   albuterol (VENTOLIN HFA) 108 (90 Base) MCG/ACT inhaler INHALE 1 TO 2 PUFFS BY MOUTH EVERY 4 TO 6 HOURS AS NEEDED FOR WHEEZING OR SHORTNESS OF BREATH     amoxicillin-clavulanate (AUGMENTIN) 875-125 MG tablet Take 1 tablet by mouth 2 (two) times daily.     LORazepam (ATIVAN) 0.5 MG tablet TAKE 1/2 TO 1 (ONE-HALF TO ONE) TABLET BY MOUTH EVERY 8 HOURS AS NEEDED FOR ANXIETY 30 tablet 0   Multiple Vitamins-Minerals (MULTI FOR HER) TABS as directed Orally     Norethindrone-Ethinyl Estradiol-Fe Biphas (LO LOESTRIN FE) 1 MG-10 MCG / 10 MCG tablet Take 1 tablet by mouth daily. 28 tablet 12   pantoprazole (PROTONIX) 20 MG tablet Take 1 tablet (20 mg total) by mouth daily. 30 tablet 6   propranolol (INDERAL) 10 MG tablet Take 1 tablet (10 mg total) by mouth daily. 30 tablet 6   tobramycin (TOBREX) 0.3 % ophthalmic solution SMARTSIG:In Eye(s)     No current facility-administered medications for this visit.  Allergies as of 01/04/2023   (No Known Allergies)    Family History  Problem Relation Age of Onset   Thyroid disease Brother        had overactive thyroid   Hypertension Mother    Diabetes Father    Stroke Father    Hypertension Father    Colon cancer Maternal Grandmother    Breast cancer Maternal Grandmother    Colon polyps Brother        Pre-cancerous   Goiter Other        Great Aunt   Alcohol abuse Other    Stomach cancer Neg Hx    Pancreatic cancer Neg Hx    Esophageal cancer Neg Hx    Liver disease Neg Hx     Social History   Socioeconomic History   Marital status: Divorced    Spouse  name: Not on file   Number of children: 2   Years of education: Not on file   Highest education level: Not on file  Occupational History   Occupation: Nurse    Employer: [employer]   Tobacco Use   Smoking status: Never   Smokeless tobacco: Never  Vaping Use   Vaping status: Never Used  Substance and Sexual Activity   Alcohol use: Not Currently    Comment: rare   Drug use: No   Sexual activity: Not Currently    Birth control/protection: Surgical, Pill    Comment: ablation and tubal  Other Topics Concern   Not on file  Social History Narrative   Regular exercise-no   Social Determinants of Health   Financial Resource Strain: Low Risk  (07/14/2022)   Overall Financial Resource Strain (CARDIA)    Difficulty of Paying Living Expenses: Not hard at all  Food Insecurity: No Food Insecurity (07/14/2022)   Hunger Vital Sign    Worried About Running Out of Food in the Last Year: Never true    Ran Out of Food in the Last Year: Never true  Transportation Needs: No Transportation Needs (07/14/2022)   PRAPARE - Administrator, Civil Service (Medical): No    Lack of Transportation (Non-Medical): No  Physical Activity: Inactive (07/14/2022)   Exercise Vital Sign    Days of Exercise per Week: 0 days    Minutes of Exercise per Session: 0 min  Stress: No Stress Concern Present (07/14/2022)   [employer] of Occupational Health - Occupational Stress Questionnaire    Feeling of Stress : Not at all  Social Connections: Socially Isolated (07/14/2022)   Social Connection and Isolation Panel [NHANES]    Frequency of Communication with Friends and Family: More than three times a week    Frequency of Social Gatherings with Friends and Family: Once a week    Attends Religious Services: Never    Database administrator or Organizations: No    Attends Banker Meetings: Never    Marital Status: Divorced  Catering manager Violence: Not At Risk (07/14/2022)   Humiliation, Afraid,  Rape, and Kick questionnaire    Fear of Current or Ex-Partner: No    Emotionally Abused: No    Physically Abused: No    Sexually Abused: No    Review of Systems:    Constitutional: No weight loss, fever or chills Skin: No rash  Cardiovascular: No chest pain  Respiratory: No SOB Gastrointestinal: See HPI and otherwise negative Genitourinary: No dysuria  Neurological: No headache, dizziness or syncope Musculoskeletal: No new muscle or joint pain Hematologic: No  bleeding  Psychiatric: No history of depression or anxiety   Physical Exam:  Vital signs: BP 118/80 (BP Location: Left Arm, Patient Position: Sitting, Cuff Size: Normal)   Pulse 80   Ht 5\' 3"  (1.6 m)   Wt 162 lb 2 oz (73.5 kg)   BMI 28.72 kg/m    Constitutional:   Pleasant Caucasian female appears to be in NAD, Well developed, Well nourished, alert and cooperative Respiratory: Respirations even and unlabored. Lungs clear to auscultation bilaterally.   No wheezes, crackles, or rhonchi.  Cardiovascular: Normal S1, S2. No MRG. Regular rate and rhythm. No peripheral edema, cyanosis or pallor.  Gastrointestinal:  Soft, nondistended, nontender. No rebound or guarding. Normal bowel sounds. No appreciable masses or hepatomegaly. Rectal:  Not performed.  Psychiatric: Demonstrates good judgement and reason without abnormal affect or behaviors.  RELEVANT LABS AND IMAGING: CBC    Component Value Date/Time   WBC 7.1 07/14/2022 1141   WBC 6.2 09/12/2017 0939   RBC 4.67 07/14/2022 1141   RBC 4.42 09/12/2017 0939   HGB 14.3 07/14/2022 1141   HCT 42.3 07/14/2022 1141   PLT 319 07/14/2022 1141   MCV 91 07/14/2022 1141   MCH 30.6 07/14/2022 1141   MCHC 33.8 07/14/2022 1141   MCHC 34.4 09/12/2017 0939   RDW 12.1 07/14/2022 1141   LYMPHSABS 2.3 07/14/2022 1141   MONOABS 0.4 09/12/2017 0939   EOSABS 0.1 07/14/2022 1141   BASOSABS 0.0 07/14/2022 1141    CMP     Component Value Date/Time   NA 142 07/14/2022 1141   K 4.2  07/14/2022 1141   CL 106 07/14/2022 1141   CO2 19 (L) 07/14/2022 1141   GLUCOSE 78 07/14/2022 1141   BUN 12 07/14/2022 1141   CREATININE 1.08 (H) 07/14/2022 1141   CALCIUM 9.4 07/14/2022 1141   PROT 6.8 07/14/2022 1141   ALBUMIN 4.4 07/14/2022 1141   AST 21 07/14/2022 1141   ALT 25 07/14/2022 1141   ALKPHOS 58 07/14/2022 1141   BILITOT 0.3 07/14/2022 1141   GFRNONAA 61 06/18/2020 1033   GFRAA 70 06/18/2020 1033    Assessment: 1.  Left lower quadrant pain: 3 distinct episodes of pain that lasted for up to a week; most likely diverticular pain/diverticulitis 2.  Chronic constipation: Treated with MiraLAX 2 to 3 days a week and daily fiber supplement, remains somewhat constipated, thought related to pelvic floor dysfunction at least partially in the past 3.  History of adenomatous polyp: On colonoscopy in 2022, repeat recommended in 3 years  Plan: 1.  Discussed with the patient that most likely she is having diverticular pain/diverticulitis during these episodes.  In order to ensure this is what it is would need a CT of the abdomen and/pelvis during those episodes.  She can call us if she is having another one and at that point recommend a CT A/P with contrast. 2.  For now encouraged the patient to use MiraLAX daily she does have known chronic constipation and this can lead to diverticulitis/diverticular pain.  Encouraged her to continue her fiber supplement. 3.  Discussed that if she does get in a pain episode she needs to decrease back to a clear liquid diet and increase her MiraLAX to twice daily in order to ensure she is having bowel movements.  Stop fiber supplement during those times. 4.  Patient is not due for another colonoscopy until next year for history of large polyp. 5.  Patient to follow in clinic with [place] as needed before  then.  Hyacinth Meeker, PA-C [facility] 01/04/2023, 8:50 AM  Cc: Tommie Sams, DO

## 2023-01-04 NOTE — Patient Instructions (Signed)
Start Miralax daily   Call if you want a CT during your next episode  Follow up as needed _______________________________________________________  If your blood pressure at your visit was 140/90 or greater, please contact your primary care physician to follow up on this.  _______________________________________________________  If you are age 47 or older, your body mass index should be between 23-30. Your Body mass index is 28.72 kg/m. If this is out of the aforementioned range listed, please consider follow up with your Primary Care Provider.  If you are age 26 or younger, your body mass index should be between 19-25. Your Body mass index is 28.72 kg/m. If this is out of the aformentioned range listed, please consider follow up with your Primary Care Provider.   ________________________________________________________  The [facility] providers would like to encourage you to use [facility] to communicate with providers for non-urgent requests or questions.  Due to long hold times on the telephone, sending your provider a message by [facility] may be a faster and more efficient way to get a response.  Please allow 48 business hours for a response.  Please remember that this is for non-urgent requests.  _______________________________________________________   Thank you for entrusting me with your care and choosing [facility].  Hyacinth Meeker PA-C

## 2023-01-13 NOTE — Progress Notes (Signed)
____________________________________________________________  Attending physician addendum:  Thank you for sending this case to me. I have reviewed the entire note and agree with the plan.  This patient would also benefit from an anorectal manometry to evaluate for possible dyssynergic defecation. And regular follow up with gynecology to see if they have any suspicion of endometriosis.  Amada Jupiter, MD  ____________________________________________________________

## 2023-03-02 ENCOUNTER — Other Ambulatory Visit: Payer: Self-pay | Admitting: Adult Health

## 2023-03-14 IMAGING — MG MM BREAST LOCALIZATION CLIP
4 series · 4 of 12 positions shown · non-contrast
Comparison: Previous exam(s).

CLINICAL DATA: Evaluate RIBBON clip placement following
ultrasound-guided RIGHT breast biopsy.

EXAM:
DIAGNOSTIC RIGHT MAMMOGRAM POST ULTRASOUND BIOPSY

[R CC synth-2D]
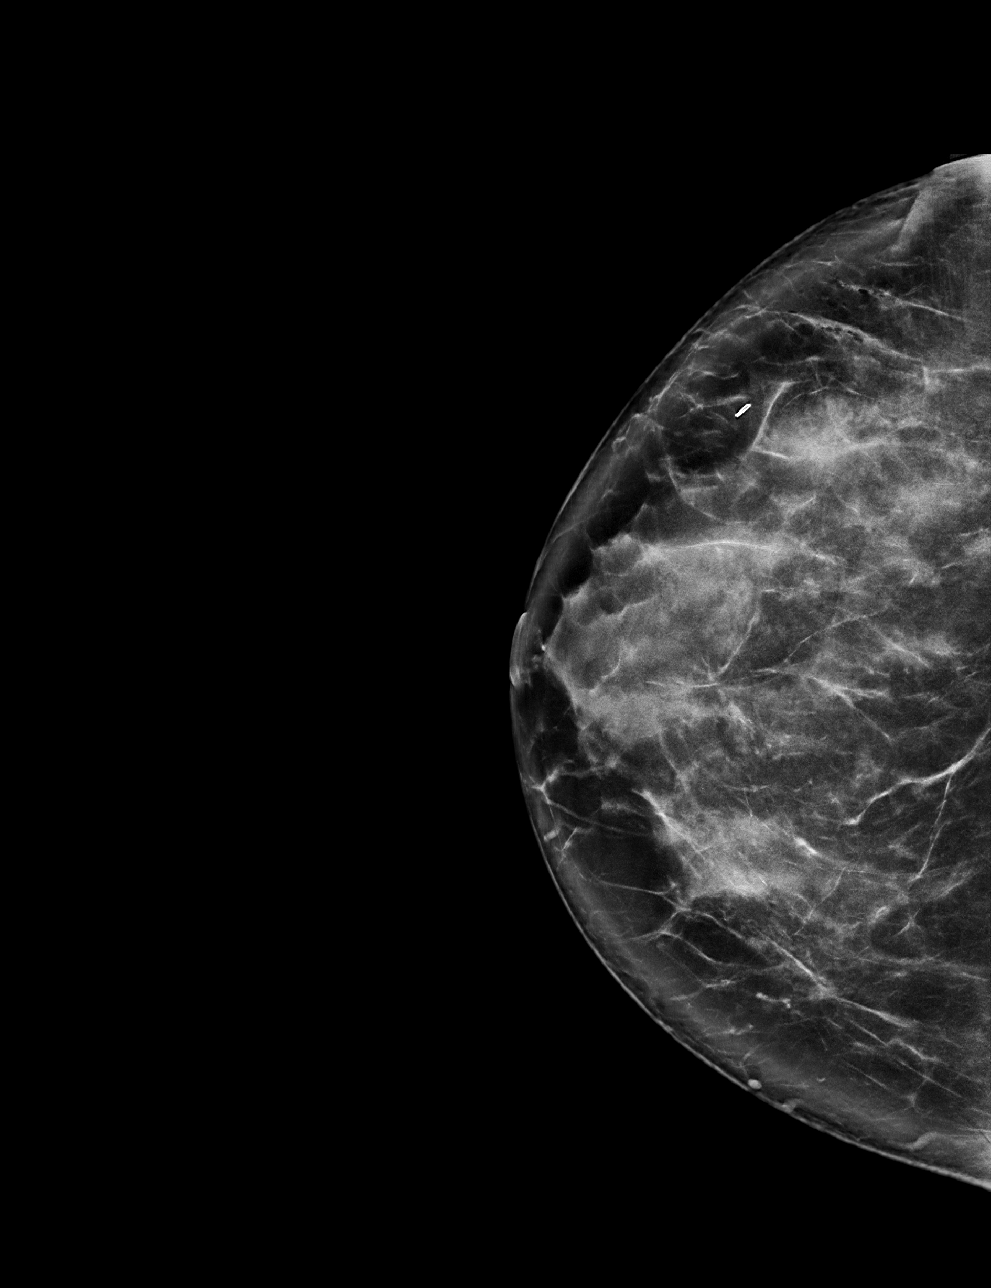

[R ML synth-2D]
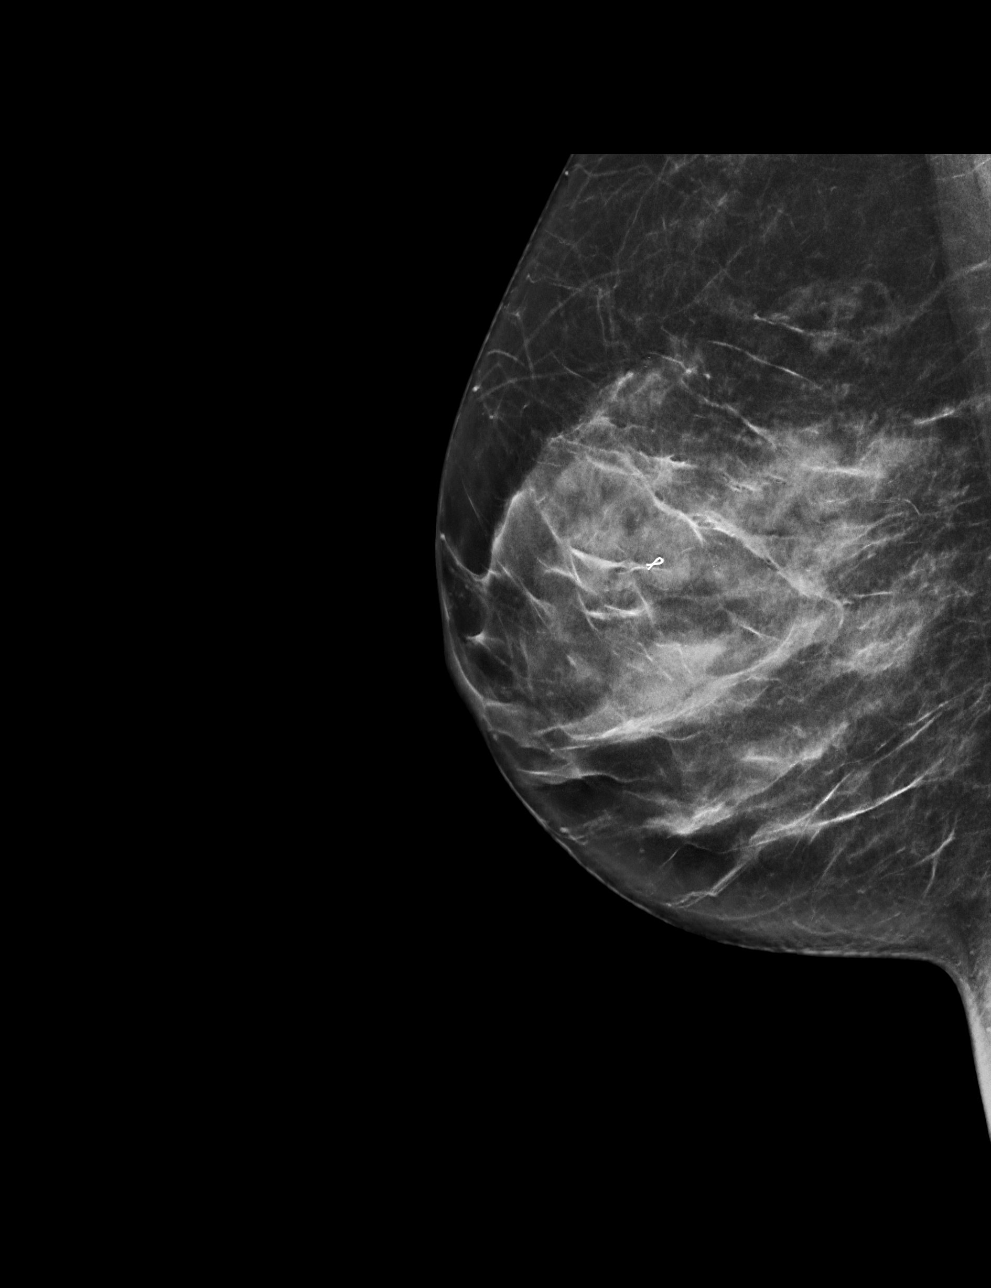

[R CC tomo · tomo slice 41/81.0]
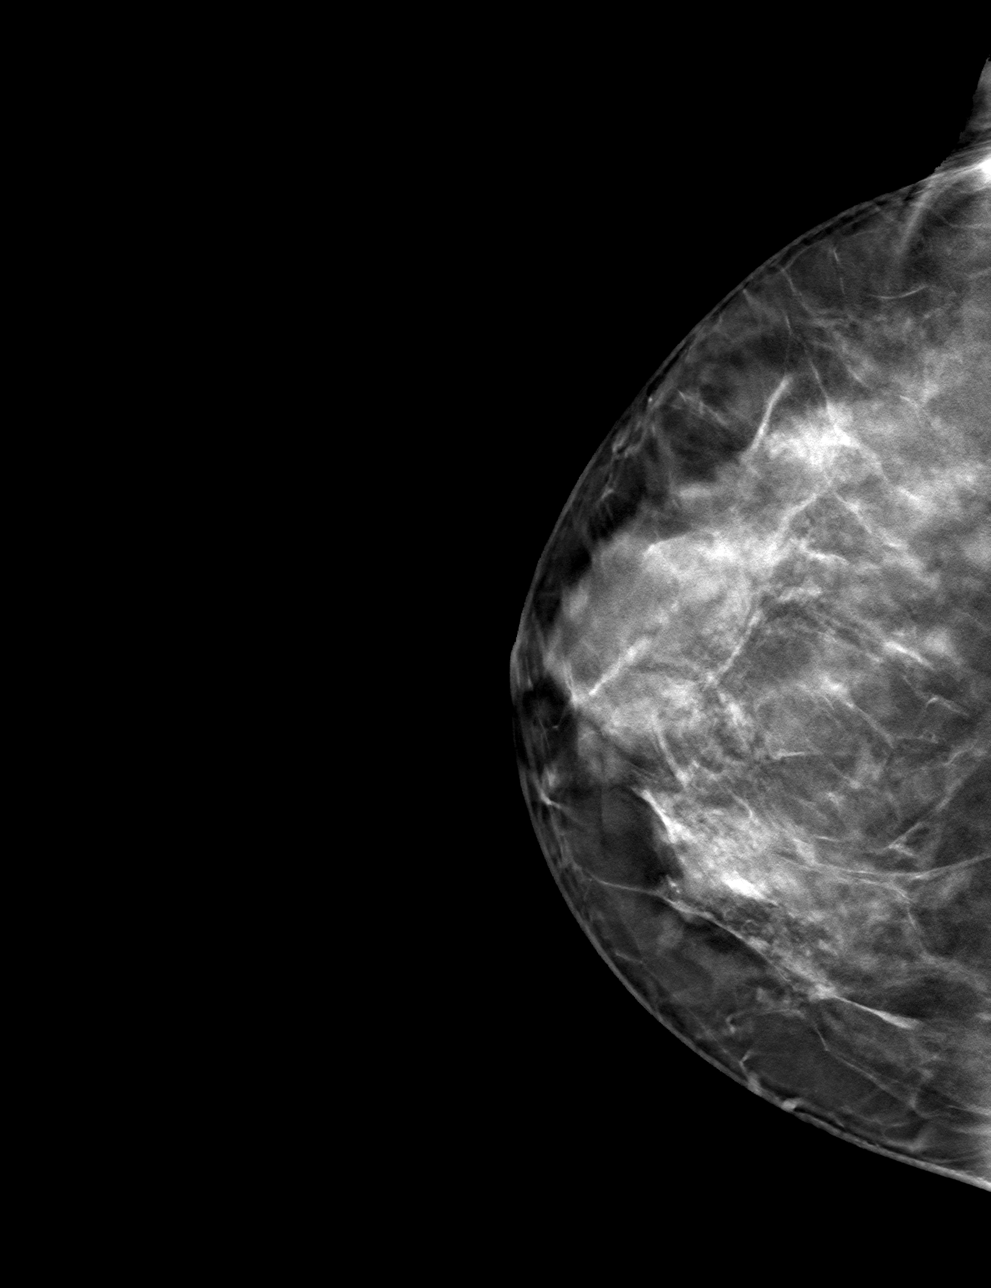

[R ML tomo · tomo slice 36/71.0]
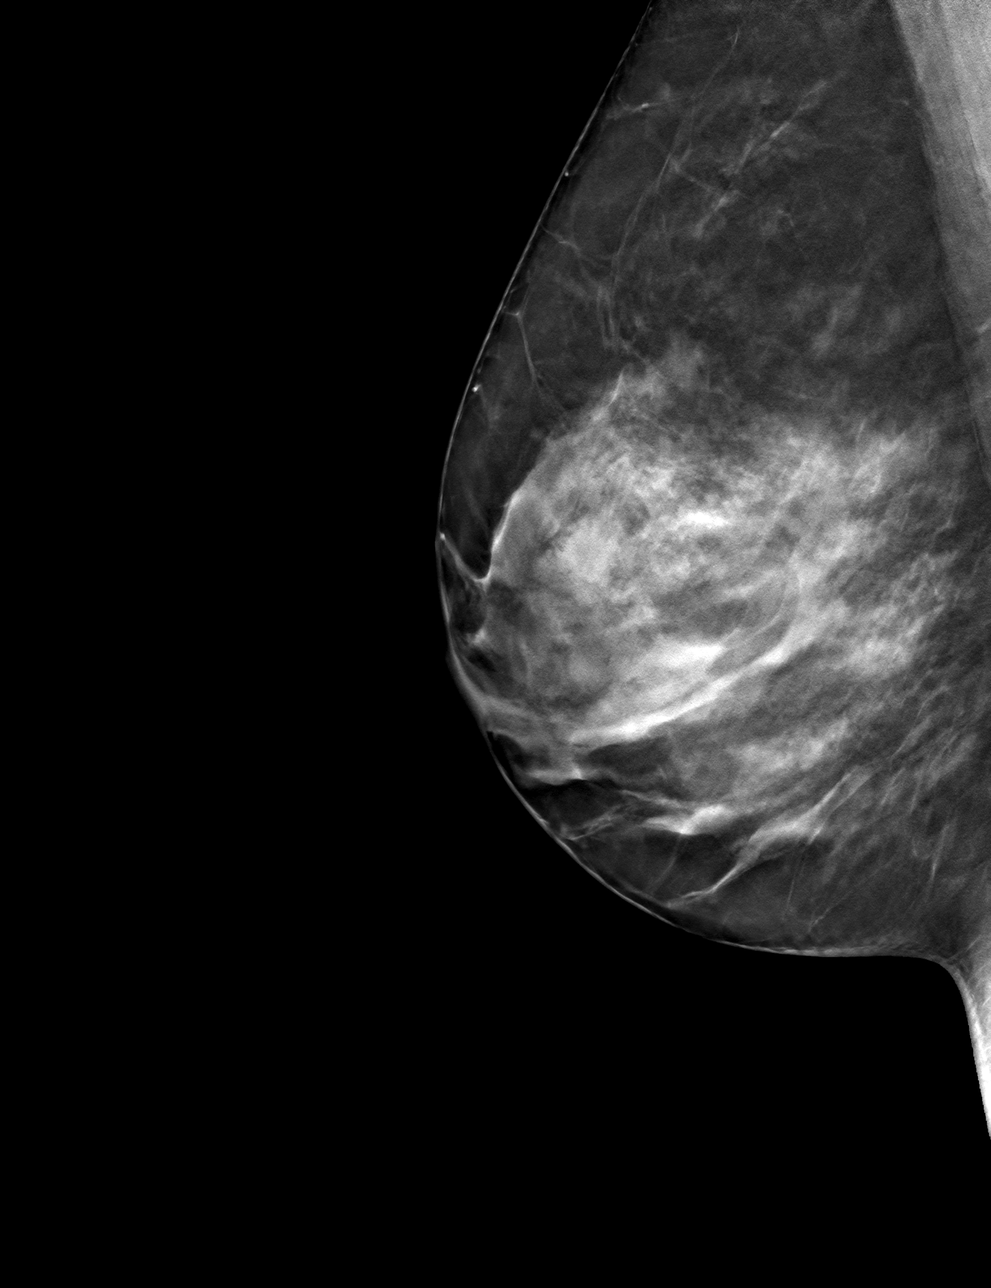

[4 of 12 positions shown; findings below may reference images not displayed]

FINDINGS: Mammographic images were obtained following ultrasound guided biopsy
of the 1.3 cm mass at the [DATE] position of the RIGHT breast. The
RIBBON biopsy marking clip is in expected position at the site of
biopsy.
IMPRESSION: Appropriate positioning of the RIBBON shaped biopsy marking clip at
the site of biopsy in the UPPER OUTER RIGHT breast.

Final Assessment: Post Procedure Mammograms for Marker Placement

## 2023-06-06 ENCOUNTER — Other Ambulatory Visit: Payer: Self-pay | Admitting: Adult Health

## 2023-06-28 ENCOUNTER — Other Ambulatory Visit: Payer: Self-pay | Admitting: Medical Genetics

## 2023-07-01 ENCOUNTER — Other Ambulatory Visit (HOSPITAL_COMMUNITY)
Admission: RE | Admit: 2023-07-01 | Discharge: 2023-07-01 | Disposition: A | Discharge status: Home / Self Care | Payer: Self-pay | Source: Ambulatory Visit | Attending: Medical Genetics | Admitting: Medical Genetics

## 2023-07-09 ENCOUNTER — Other Ambulatory Visit: Payer: Self-pay | Admitting: Adult Health

## 2023-07-11 LAB — GENECONNECT MOLECULAR SCREEN: Genetic Analysis Overall Interpretation: NEGATIVE

## 2023-07-21 ENCOUNTER — Ambulatory Visit: Payer: 59 | Admitting: Family Medicine

## 2023-07-21 ENCOUNTER — Ambulatory Visit: Payer: 59 | Admitting: Adult Health

## 2023-07-21 VITALS — BP 118/79 | HR 81 | Ht 63.0 in | Wt 159.5 lb

## 2023-07-21 VITALS — BP 113/80 | HR 77 | Temp 97.2°F | Ht 63.0 in | Wt 159.0 lb

## 2023-07-21 DIAGNOSIS — Z1211 Encounter for screening for malignant neoplasm of colon: Secondary | ICD-10-CM

## 2023-07-21 DIAGNOSIS — Z1331 Encounter for screening for depression: Secondary | ICD-10-CM | POA: Diagnosis not present

## 2023-07-21 DIAGNOSIS — Z7184 Encounter for health counseling related to travel: Secondary | ICD-10-CM

## 2023-07-21 DIAGNOSIS — Z1329 Encounter for screening for other suspected endocrine disorder: Secondary | ICD-10-CM

## 2023-07-21 DIAGNOSIS — Z7689 Persons encountering health services in other specified circumstances: Secondary | ICD-10-CM

## 2023-07-21 DIAGNOSIS — E049 Nontoxic goiter, unspecified: Secondary | ICD-10-CM

## 2023-07-21 DIAGNOSIS — Z01419 Encounter for gynecological examination (general) (routine) without abnormal findings: Secondary | ICD-10-CM

## 2023-07-21 DIAGNOSIS — Z13220 Encounter for screening for lipoid disorders: Secondary | ICD-10-CM

## 2023-07-21 LAB — HEMOCCULT GUIAC POC 1CARD (OFFICE): Fecal Occult Blood, POC: NEGATIVE

## 2023-07-21 MED ORDER — LO LOESTRIN FE 1 MG-10 MCG / 10 MCG PO TABS: 1.0000 | ORAL_TABLET | Freq: Every day | ORAL | 12 refills | Status: AC

## 2023-07-21 NOTE — Patient Instructions (Signed)
I will send in meds before your trip.  Take care  Dr. Adriana Simas

## 2023-07-21 NOTE — Progress Notes (Addendum)
Patient ID: Merrilyn Puma, female   DOB: 12/28/1975, 48 y.o.   MRN: [id number] History of Present Illness: Kristi Medina is a 48 year old white female, divorced, G2P2002 in for a well woman gyn exam. She is going on mission trip in May to [place], and seeing PCP after this appointment for any meds or vaccines needed.      Component Value Date/Time   DIAGPAP  06/30/2021 1556    - Negative for Intraepithelial Lesions or Malignancy (NILM)   DIAGPAP - Benign reactive/reparative changes 06/30/2021 1556   DIAGPAP - Low grade squamous intraepithelial lesion (LSIL) (A) 06/18/2020 0939   HPVHIGH Negative 06/30/2021 1556   HPVHIGH Negative 06/18/2020 0939   HPVHIGH Positive (A) 06/18/2019 1050   ADEQPAP  06/30/2021 1556    Satisfactory for evaluation; transformation zone component PRESENT.   ADEQPAP  06/18/2020 0939    Satisfactory for evaluation; transformation zone component PRESENT.   ADEQPAP  06/18/2019 1050    Satisfactory for evaluation; transformation zone component PRESENT.   PCP is Dr Adriana Simas    Current Medications, Allergies, Past Medical History, Past Surgical History, Family History and Social History were reviewed in [facility] electronic medical record.     Review of Systems: Patient denies any headaches, hearing loss, fatigue, blurred vision, shortness of breath, chest pain, abdominal pain, problems with bowel movements, urination, or intercourse.(Not having sex).  No joint pain or mood swings.  Does not have a period with lo Loestrin, is having some night sweats  Denies any trouble swallowing, has had pain in right breast at times   Physical Exam:BP 118/79 (BP Location: Left Arm, Patient Position: Sitting, Cuff Size: Normal)   Pulse 81   Ht 5\' 3"  (1.6 m)   Wt 159 lb 8 oz (72.3 kg)   BMI 28.25 kg/m   General:  Well developed, well nourished, no acute distress Skin:  Warm and dry Neck:  Midline trachea, + thyroid goiter, good ROM, no lymphadenopathy Lungs; Clear to auscultation  bilaterally Breast:  No dominant palpable mass, retraction, or nipple discharge Cardiovascular: Regular rate and rhythm Abdomen:  Soft, non tender, no hepatosplenomegaly Pelvic:  External genitalia is normal in appearance, no lesions.  The vagina is normal in appearance. Urethra has no lesions or masses. The cervix is smooth..  Uterus is felt to be normal size, shape, and contour.  No adnexal masses or tenderness noted.Bladder is non tender, no masses felt. Rectal: Good sphincter tone, no polyps, or hemorrhoids felt.  Hemoccult negative. Extremities/musculoskeletal:  No swelling or varicosities noted, no clubbing or cyanosis Psych:  No mood changes, alert and cooperative,seems happy AA is 0 Fall risk is low    07/21/2023    1:34 PM 07/14/2022   10:27 AM 06/30/2021    3:39 PM  Depression screen PHQ 2/9  Decreased Interest 0 0 0  Down, Depressed, Hopeless 0 0 0  PHQ - 2 Score 0 0 0  Altered sleeping 0 0 2  Tired, decreased energy 0 1 1  Change in appetite 0 0 0  Feeling bad or failure about yourself  0 0 0  Trouble concentrating 0 0 0  Moving slowly or fidgety/restless 0 0 0  Suicidal thoughts 0 0 0  PHQ-9 Score 0 1 3       07/21/2023    1:35 PM 07/14/2022   10:27 AM 06/30/2021    3:39 PM 06/18/2020    9:33 AM  GAD 7 : Generalized Anxiety Score  Nervous, Anxious, on Edge 0 1  0 0  Control/stop worrying 0 0 0 0  Worry too much - different things 0 0 0 0  Trouble relaxing 0 0 0 0  Restless 0 0 0 0  Easily annoyed or irritable 0 0 0 0  Afraid - awful might happen 0 0 0 0  Total GAD 7 Score 0 1 0 0    Upstream - 07/21/23 1342       Pregnancy Intention Screening   Does the patient want to become pregnant in the next year? No    Does the patient's partner want to become pregnant in the next year? No    Would the patient like to discuss contraceptive options today? No      Contraception Wrap Up   Current Method Abstinence;Female Sterilization;Oral Contraceptive    End Method  Abstinence;Female Sterilization;Oral Contraceptive    Contraception Counseling Provided Yes              Examination chaperoned by Malachy Mood LPN  Impression and plan: 1. Encounter for well woman exam with routine gynecological exam (Primary) Pap and physical in 1 year Will check labs Had mammogram 10/25/22, calcifications on the right, and then follow up 11/05/22 and biopsy 11/10/22,benign  - CBC - Comprehensive metabolic panel - Lipid panel Colonoscopy per GI  Stay active  2. Encounter for screening fecal occult blood testing Hemoccult was negative  - POCT occult blood stool  3. Encounter for menstrual regulation No periods on lo Loestrin, will refill and gave 6 samples Meds ordered this encounter  Medications   Norethindrone-Ethinyl Estradiol-Fe Biphas (LO LOESTRIN FE) 1 MG-10 MCG / 10 MCG tablet    Sig: Take 1 tablet by mouth daily.    Dispense:  28 tablet    Refill:  12    Supervising Provider:   Despina Hidden, LUTHER H [[id number]]     4. Goiter +goiter, had [place] 06/09/21, had single left nodule, no follow up needed Will check TSH and free T4 - TSH + free T4  5. Screening cholesterol level - Lipid panel  6. Screening for thyroid disorder - TSH + free T4

## 2023-07-22 LAB — COMPREHENSIVE METABOLIC PANEL
ALT: 25 [IU]/L (ref 0–32)
AST: 21 [IU]/L (ref 0–40)
Albumin: 4.3 g/dL (ref 3.9–4.9)
Alkaline Phosphatase: 52 [IU]/L (ref 44–121)
BUN/Creatinine Ratio: 14 (ref 9–23)
BUN: 15 mg/dL (ref 6–24)
Bilirubin Total: <0.2 mg/dL (ref 0.0–1.2)
CO2: 23 mmol/L (ref 20–29)
Calcium: 9.8 mg/dL (ref 8.7–10.2)
Chloride: 107 mmol/L — ABNORMAL HIGH (ref 96–106)
Creatinine, Ser: 1.07 mg/dL — ABNORMAL HIGH (ref 0.57–1.00)
Globulin, Total: 2.5 g/dL (ref 1.5–4.5)
Glucose: 91 mg/dL (ref 70–99)
Potassium: 4.1 mmol/L (ref 3.5–5.2)
Sodium: 143 mmol/L (ref 134–144)
Total Protein: 6.8 g/dL (ref 6.0–8.5)
eGFR: 64 mL/min/{1.73_m2} (ref >59)

## 2023-07-22 LAB — LIPID PANEL
Chol/HDL Ratio: 3.7 {ratio} (ref 0.0–4.4)
Cholesterol, Total: 168 mg/dL (ref 100–199)
HDL: 46 mg/dL (ref >39)
LDL Chol Calc (NIH): 89 mg/dL (ref 0–99)
Triglycerides: 197 mg/dL — ABNORMAL HIGH (ref 0–149)
VLDL Cholesterol Cal: 33 mg/dL (ref 5–40)

## 2023-07-22 LAB — CBC
Hematocrit: 43.8 % (ref 34.0–46.6)
Hemoglobin: 14.8 g/dL (ref 11.1–15.9)
MCH: 31.4 pg (ref 26.6–33.0)
MCHC: 33.8 g/dL (ref 31.5–35.7)
MCV: 93 fL (ref 79–97)
Platelets: 323 10*3/uL (ref 150–450)
RBC: 4.72 x10E6/uL (ref 3.77–5.28)
RDW: 11.8 % (ref 11.7–15.4)
WBC: 8.4 10*3/uL (ref 3.4–10.8)

## 2023-07-22 LAB — TSH+FREE T4
Free T4: 1.16 ng/dL (ref 0.82–1.77)
TSH: 0.976 u[IU]/mL (ref 0.450–4.500)

## 2023-07-24 DIAGNOSIS — Z7184 Encounter for health counseling related to travel: Secondary | ICD-10-CM | POA: Insufficient documentation

## 2023-07-24 NOTE — Progress Notes (Signed)
 Subjective:  Patient ID: Kristi Medina, female    DOB: 12/26/1975  Age: 48 y.o. MRN: [id number]  CC:   Chief Complaint  Patient presents with   taking a trip out of the country ot [place]    Wants to know if she has all the required travel vaccination req.    HPI:  48 year old female presents for evaluation of the above.  Patient states that she is going on a mission trip to [place] in May.  Patient wants to make sure that she is up-to-date regarding vaccinations and to see if she needs anything prior to her travel.  According to the EMR and [facility], we have no documentation regarding polio vaccine or varicella vaccine.  Patient states that she believes that she is up-to-date regarding this and she will look at her records at home.  I reviewed the recommendations from CDC regarding travel to [place].  There is a concern for malaria.  Will discuss with pharmacy regarding best medication for prophylaxis.  The remaining recommendations from [facility] are regarding routine vaccinations.  Patient Active Problem List   Diagnosis Date Noted   Travel advice encounter 07/24/2023   Goiter 06/30/2021   Gastroesophageal reflux disease without esophagitis 11/24/2020   Peri-menopause 11/24/2020   Hypertension 06/18/2020   Abnormal Papanicolaou smear of cervix with positive human papilloma virus (HPV) test 06/22/2019   Palpitations 06/18/2019   Cough variant asthma vs UACS  08/09/2017   Constipation, chronic 08/22/2015   Allergic rhinitis 06/11/2010    Social Hx   Social History   Socioeconomic History   Marital status: Divorced    Spouse name: Not on file   Number of children: 2   Years of education: Not on file   Highest education level: Master's degree (e.g., MA, MS, MEng, MEd, MSW, MBA)  Occupational History   Occupation: Nurse    Employer: [employer]  Tobacco Use   Smoking status: Never   Smokeless tobacco: Never  Vaping Use   Vaping status: Never Used  Substance and  Sexual Activity   Alcohol use: Not Currently    Comment: rare   Drug use: No   Sexual activity: Not Currently    Birth control/protection: Surgical, Pill    Comment: ablation and tubal  Other Topics Concern   Not on file  Social History Narrative   Regular exercise-no   Social Drivers of Health   Financial Resource Strain: Low Risk  (07/21/2023)   Overall Financial Resource Strain (CARDIA)    Difficulty of Paying Living Expenses: Not hard at all  Food Insecurity: No Food Insecurity (07/21/2023)   Hunger Vital Sign    Worried About Running Out of Food in the Last Year: Never true    Ran Out of Food in the Last Year: Never true  Transportation Needs: No Transportation Needs (07/21/2023)   PRAPARE - Administrator, Civil Service (Medical): No    Lack of Transportation (Non-Medical): No  Physical Activity: Insufficiently Active (07/21/2023)   Exercise Vital Sign    Days of Exercise per Week: 1 day    Minutes of Exercise per Session: 20 min  Stress: No Stress Concern Present (07/21/2023)   [employer] of Occupational Health - Occupational Stress Questionnaire    Feeling of Stress : Not at all  Social Connections: Moderately Integrated (07/21/2023)   Social Connection and Isolation Panel [NHANES]    Frequency of Communication with Friends and Family: More than three times a week    Frequency of  Social Gatherings with Friends and Family: Twice a week    Attends Religious Services: More than 4 times per year    Active Member of [employer] or Organizations: Yes    Attends Engineer, structural: More than 4 times per year    Marital Status: Divorced    Review of Systems  Constitutional: Negative.      Objective:  BP 113/80   Pulse 77   Temp (!) 97.2 F (36.2 C)   Ht 5\' 3"  (1.6 m)   Wt 159 lb (72.1 kg)   SpO2 98%   BMI 28.17 kg/m      07/21/2023    2:43 PM 07/21/2023    1:37 PM 01/04/2023    8:50 AM  BP/Weight  Systolic BP 113 118 118  Diastolic BP 80  79 80  Wt. (Lbs) 159 159.5 162.13  BMI 28.17 kg/m2 28.25 kg/m2 28.72 kg/m2    Physical Exam Vitals and nursing note reviewed.  Constitutional:      General: She is not in acute distress.    Appearance: Normal appearance.  HENT:     Head: Normocephalic and atraumatic.  Eyes:     General:        Right eye: No discharge.        Left eye: No discharge.     Conjunctiva/sclera: Conjunctivae normal.  Cardiovascular:     Rate and Rhythm: Normal rate and regular rhythm.  Pulmonary:     Effort: Pulmonary effort is normal.     Breath sounds: Normal breath sounds. No wheezing, rhonchi or rales.  Neurological:     Mental Status: She is alert.  Psychiatric:        Mood and Affect: Mood normal.        Behavior: Behavior normal.     Lab Results  Component Value Date   WBC 8.4 07/21/2023   HGB 14.8 07/21/2023   HCT 43.8 07/21/2023   PLT 323 07/21/2023   GLUCOSE 91 07/21/2023   CHOL 168 07/21/2023   TRIG 197 (H) 07/21/2023   HDL 46 07/21/2023   LDLCALC 89 07/21/2023   ALT 25 07/21/2023   AST 21 07/21/2023   NA 143 07/21/2023   K 4.1 07/21/2023   CL 107 (H) 07/21/2023   CREATININE 1.07 (H) 07/21/2023   BUN 15 07/21/2023   CO2 23 07/21/2023   TSH 0.976 07/21/2023     Assessment & Plan:   Problem List Items Addressed This Visit       Other   Travel advice encounter - Primary   I discussed the recommendations with her from the CDC regarding travel to [place].  It appears that she needs prophylaxis regarding malaria.  I have discussed this with my pharmacist, Vanice Sarah.  She is going to look into this and get back with me regarding the the best medication for prophylaxis.      20 minutes were spent during this visit reviewing CDC recommendations regarding travel to [place] and relaying this to the patient as well as obtaining history and doing a physical exam.  I have discussed her case with pharmacist, Vanice Sarah.  I will be sending in medication once pharmacist and  myself to decide on the best regimen for prophylaxis given the side effect profiles, cost, availability, and resistance patterns and [place].   Everlene Other DO [facility]

## 2023-07-24 NOTE — Assessment & Plan Note (Signed)
 I discussed the recommendations with her from the CDC regarding travel to [place].  It appears that she needs prophylaxis regarding malaria.  I have discussed this with my pharmacist, Vanice Sarah.  She is going to look into this and get back with me regarding the the best medication for prophylaxis.

## 2023-07-28 ENCOUNTER — Other Ambulatory Visit: Payer: Self-pay | Admitting: Family Medicine

## 2023-07-28 ENCOUNTER — Encounter: Payer: Self-pay | Admitting: Family Medicine

## 2023-07-28 MED ORDER — ATOVAQUONE-PROGUANIL HCL 250-100 MG PO TABS: 1.0000 | ORAL_TABLET | Freq: Every day | ORAL | 0 refills | Status: DC

## 2023-08-04 ENCOUNTER — Encounter: Payer: Self-pay | Admitting: Gastroenterology

## 2023-08-17 ENCOUNTER — Ambulatory Visit (AMBULATORY_SURGERY_CENTER)

## 2023-08-17 VITALS — Ht 63.0 in | Wt 160.0 lb

## 2023-08-17 DIAGNOSIS — Z860100 Personal history of colon polyps, unspecified: Secondary | ICD-10-CM

## 2023-08-17 NOTE — Progress Notes (Signed)
 No egg or soy allergy known to patient  No issues known to pt with past sedation with any surgeries or procedures Patient denies ever being told they had issues or difficulty with intubation  No FH of Malignant Hyperthermia Pt is not on diet pills Pt is not on  home 02  Pt is not on blood thinners  Pt has issues with constipation and takes Miralax No A fib or A flutter Have any cardiac testing pending--no Pt can ambulate independently Pt denies use of chewing tobacco Discussed diabetic I weight loss medication holds Discussed NSAID holds Checked BMI Pt instructed to use Singlecare.com or GoodRx for a price reduction on prep  Patient's chart reviewed by Cathlyn Parsons CNRA prior to previsit and patient appropriate for the [facility].  Pre visit completed and red dot placed by patient's name on their procedure day (on provider's schedule).

## 2023-09-02 ENCOUNTER — Encounter: Payer: Self-pay | Admitting: Gastroenterology

## 2023-09-07 ENCOUNTER — Ambulatory Visit: Admitting: Gastroenterology

## 2023-09-07 ENCOUNTER — Encounter: Payer: Self-pay | Admitting: Gastroenterology

## 2023-09-07 VITALS — BP 114/78 | HR 81 | Temp 98.8°F | Resp 17 | Ht 63.0 in | Wt 160.0 lb

## 2023-09-07 DIAGNOSIS — D120 Benign neoplasm of cecum: Secondary | ICD-10-CM | POA: Diagnosis not present

## 2023-09-07 DIAGNOSIS — K5730 Diverticulosis of large intestine without perforation or abscess without bleeding: Secondary | ICD-10-CM | POA: Diagnosis not present

## 2023-09-07 DIAGNOSIS — Z1211 Encounter for screening for malignant neoplasm of colon: Secondary | ICD-10-CM | POA: Diagnosis present

## 2023-09-07 DIAGNOSIS — Z860100 Personal history of colon polyps, unspecified: Secondary | ICD-10-CM

## 2023-09-07 MED ORDER — SODIUM CHLORIDE 0.9 % IV SOLN
500.0000 mL | Freq: Once | INTRAVENOUS | Status: DC
Start: 2023-09-07 — End: 2023-09-07

## 2023-09-07 NOTE — Patient Instructions (Addendum)
 Resume previous diet and medications.  Handouts provided on polyps and diverticulosis.  Repeat colonoscopy based on biopsy results.    YOU HAD AN ENDOSCOPIC PROCEDURE TODAY AT THE [facility]:   Refer to the procedure report that was given to you for any specific questions about what was found during the examination.  If the procedure report does not answer your questions, please call your gastroenterologist to clarify.  If you requested that your care partner not be given the details of your procedure findings, then the procedure report has been included in a sealed envelope for you to review at your convenience later.  YOU SHOULD EXPECT: Some feelings of bloating in the abdomen. Passage of more gas than usual.  Walking can help get rid of the air that was put into your GI tract during the procedure and reduce the bloating. If you had a lower endoscopy (such as a colonoscopy or flexible sigmoidoscopy) you may notice spotting of blood in your stool or on the toilet paper. If you underwent a bowel prep for your procedure, you may not have a normal bowel movement for a few days.  Please Note:  You might notice some irritation and congestion in your nose or some drainage.  This is from the oxygen used during your procedure.  There is no need for concern and it should clear up in a day or so.  SYMPTOMS TO REPORT IMMEDIATELY:  Following lower endoscopy (colonoscopy or flexible sigmoidoscopy):  Excessive amounts of blood in the stool  Significant tenderness or worsening of abdominal pains  Swelling of the abdomen that is new, acute  Fever of 100F or higher  For urgent or emergent issues, a gastroenterologist can be reached at any hour by calling [phone]. Do not use MyChart messaging for urgent concerns.    DIET:  We do recommend a small meal at first, but then you may proceed to your regular diet.  Drink plenty of fluids but you should avoid alcoholic beverages for 24  hours.  ACTIVITY:  You should plan to take it easy for the rest of today and you should NOT DRIVE or use heavy machinery until tomorrow (because of the sedation medicines used during the test).    FOLLOW UP: Our staff will call the number listed on your records the next business day following your procedure.  We will call around 7:15- 8:00 am to check on you and address any questions or concerns that you may have regarding the information given to you following your procedure. If we do not reach you, we will leave a message.     If any biopsies were taken you will be contacted by phone or by letter within the next 1-3 weeks.  Please call us at [phone] if you have not heard about the biopsies in 3 weeks.    SIGNATURES/CONFIDENTIALITY: You and/or your care partner have signed paperwork which will be entered into your electronic medical record.  These signatures attest to the fact that that the information above on your After Visit Summary has been reviewed and is understood.  Full responsibility of the confidentiality of this discharge information lies with you and/or your care-partner.

## 2023-09-07 NOTE — Progress Notes (Signed)
 History and Physical:  This patient presents for endoscopic testing for: Encounter Diagnosis  Name Primary?   History of colonic polyps Yes    Surveillance colonoscopy today for Hx of adenomatous polyp on the ICV in Mar 2022. Chronic constipation as well (also seen for that in 2022).   Patient is otherwise without complaints or active issues today.   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   GERD (gastroesophageal reflux disease)    GOITER, MULTINODULAR 06/11/2010   Hypertension    RUQ pain 08/22/2015   THYROIDITIS 06/11/2010   Vaginal Pap smear, abnormal      Past Surgical History: Past Surgical History:  Procedure Laterality Date   BREAST BIOPSY Right 10/2020   BREAST BIOPSY Right 11/10/2022   MM RT BREAST BX W LOC DEV 1ST LESION IMAGE BX SPEC STEREO GUIDE 11/10/2022 [facility]   ENDOMETRIAL ABLATION  06/07/2004   for menorrhagia   TUBAL LIGATION  06/07/2004    Allergies: No Known Allergies  Outpatient Meds: Current Outpatient Medications  Medication Sig Dispense Refill   CELEBREX 200 MG capsule Take 1 capsule every day by oral route for 30 days.     levocetirizine (XYZAL) 5 MG tablet 1 tablet in the evening Orally Once a day for 30 day(s)     Multiple Vitamins-Minerals (MULTI FOR HER) TABS as directed Orally     Norethindrone-Ethinyl Estradiol-Fe Biphas (LO LOESTRIN FE) 1 MG-10 MCG / 10 MCG tablet Take 1 tablet by mouth daily. 28 tablet 12   pantoprazole (PROTONIX) 20 MG tablet Take 1 tablet by mouth once daily 30 tablet 6   propranolol (INDERAL) 10 MG tablet Take 1 tablet by mouth once daily 30 tablet 12   atovaquone-proguanil (MALARONE) 250-100 MG TABS tablet Take 1 tablet by mouth daily. Start 1 to 2 days prior to  entering a malaria-endemic area, continue throughout the stay and for 7 days after returning. (Patient not taking: Reported on 09/07/2023) 14 tablet 0   Current Facility-Administered Medications  Medication Dose Route Frequency Provider Last Rate Last Admin   0.9 %  sodium chloride infusion  500 mL Intravenous Once Sherrilyn Rist, MD          ___________________________________________________________________ Objective   Exam:  BP 119/82   Pulse 96   Temp 98.8 F (37.1 C) (Temporal)   Resp 11   Ht 5\' 3"  (1.6 m)   Wt 160 lb (72.6 kg)   SpO2 99%   BMI 28.34 kg/m   CV: regular , S1/S2 Resp: clear to auscultation bilaterally, normal RR and effort noted GI: soft, no tenderness, with active bowel sounds.   Assessment: Encounter Diagnosis  Name Primary?   History of colonic polyps Yes     Plan: Colonoscopy   The benefits and risks of the planned procedure(s) were described in detail with the patient or (when appropriate) their health care proxy.  Risks were outlined as including, but not limited to, bleeding, infection, perforation, adverse medication reaction leading to cardiac or pulmonary decompensation, pancreatitis (if ERCP).  The limitation of incomplete mucosal visualization was also discussed.  No guarantees or warranties were given.  The patient is appropriate for an endoscopic procedure in the ambulatory setting.   - Amada Jupiter, MD

## 2023-09-07 NOTE — Progress Notes (Signed)
 To pacu, VSS. Report to [email or web address].tb

## 2023-09-07 NOTE — Progress Notes (Signed)
 Pt's states no medical or surgical changes since previsit or office visit.

## 2023-09-07 NOTE — Op Note (Signed)
 [facility] Patient Name: Kristi Medina Procedure Date: 09/07/2023 1:24 PM MRN: [id number] Endoscopist: Sherilyn Cooter L. Myrtie Neither , MD, [id number] Age: 48 Referring MD:  Date of Birth: April 21, 1976 Gender: Female Account #: [id number] Procedure:                Colonoscopy Indications:              Surveillance: Personal history of adenomatous                            polyps on last colonoscopy 3 years ago                           15 mm tubular adenoma on the ICV March 2022 Medicines:                Monitored Anesthesia Care Procedure:                Pre-Anesthesia Assessment:                           - Prior to the procedure, a History and Physical                            was performed, and patient medications and                            allergies were reviewed. The patient's tolerance of                            previous anesthesia was also reviewed. The risks                            and benefits of the procedure and the sedation                            options and risks were discussed with the patient.                            All questions were answered, and informed consent                            was obtained. Prior Anticoagulants: The patient has                            taken no anticoagulant or antiplatelet agents. ASA                            Grade Assessment: II - A patient with mild systemic                            disease. After reviewing the risks and benefits,                            the patient was deemed in satisfactory condition to  undergo the procedure.                           After obtaining informed consent, the colonoscope                            was passed under direct vision. Throughout the                            procedure, the patient's blood pressure, pulse, and                            oxygen saturations were monitored continuously. The                            Olympus Scope SN: [id number]  was introduced through                            the anus and advanced to the the cecum, identified                            by appendiceal orifice and ileocecal valve. The                            colonoscopy was somewhat difficult due to multiple                            diverticula in the colon and a tortuous colon. The                            patient tolerated the procedure well. The quality                            of the bowel preparation was good. The ileocecal                            valve, appendiceal orifice, and rectum were                            photographed. Scope In: 1:46:42 PM Scope Out: 2:01:12 PM Scope Withdrawal Time: 0 hours 9 minutes 47 seconds  Total Procedure Duration: 0 hours 14 minutes 30 seconds  Findings:                 The perianal and digital rectal examinations were                            normal.                           Repeat examination of right colon under NBI                            performed.  A post polypectomy scar was found at the ileocecal                            valve. There was no evidence of the previous polyp.                            (Examined under WL and NBI)                           A diminutive polyp was found in the cecum. The                            polyp was sessile. The polyp was removed with a                            cold snare. Resection and retrieval were complete.                           Multiple diverticula were found in the left colon                            and right colon. (Left > right)                           The exam was otherwise without abnormality on                            direct and retroflexion views. Complications:            No immediate complications. Estimated Blood Loss:     Estimated blood loss was minimal. Impression:               - Post-polypectomy scar at the ileocecal valve.                           - One diminutive polyp in the  cecum, removed with a                            cold snare. Resected and retrieved.                           - Diverticulosis in the left colon and in the right                            colon.                           - The examination was otherwise normal on direct                            and retroflexion views. Recommendation:           - Patient has a contact number available for  emergencies. The signs and symptoms of potential                            delayed complications were discussed with the                            patient. Return to normal activities tomorrow.                            Written discharge instructions were provided to the                            patient.                           - Resume previous diet.                           - Continue present medications.                           - Await pathology results.                           - Repeat colonoscopy is recommended for                            surveillance. The colonoscopy date will be                            determined after pathology results from today's                            exam become available for review. Madalaine Portier L. Myrtie Neither, MD 09/07/2023 2:11:10 PM This report has been signed electronically.

## 2023-09-07 NOTE — Progress Notes (Signed)
 Called to room to assist during endoscopic procedure.  Patient ID and intended procedure confirmed with present staff. Received instructions for my participation in the procedure from the performing physician.

## 2023-09-08 ENCOUNTER — Telehealth: Payer: Self-pay

## 2023-09-08 NOTE — Telephone Encounter (Signed)
  Follow up Call-     09/07/2023   12:47 PM  Call back number  Post procedure Call Back phone  # [phone]  Permission to leave phone message Yes     Patient questions:  Do you have a fever, pain , or abdominal swelling? No. Pain Score  0 *  Have you tolerated food without any problems? Yes.    Have you been able to return to your normal activities? Yes.    Do you have any questions about your discharge instructions: Diet   No. Medications  No. Follow up visit  No.  Do you have questions or concerns about your Care? No.  Actions: * If pain score is 4 or above: No action needed, pain <4.

## 2023-09-12 LAB — SURGICAL PATHOLOGY

## 2023-09-13 ENCOUNTER — Encounter: Payer: Self-pay | Admitting: Gastroenterology

## 2023-09-29 ENCOUNTER — Other Ambulatory Visit: Payer: Self-pay | Admitting: Adult Health

## 2023-10-12 ENCOUNTER — Other Ambulatory Visit: Payer: Self-pay | Admitting: Family Medicine

## 2023-10-12 DIAGNOSIS — Z1231 Encounter for screening mammogram for malignant neoplasm of breast: Secondary | ICD-10-CM

## 2023-10-25 ENCOUNTER — Other Ambulatory Visit: Payer: Self-pay | Admitting: Orthopedic Surgery

## 2023-10-25 DIAGNOSIS — E041 Nontoxic single thyroid nodule: Secondary | ICD-10-CM

## 2023-10-26 ENCOUNTER — Ambulatory Visit
Admission: RE | Admit: 2023-10-26 | Discharge: 2023-10-26 | Disposition: A | Discharge status: Home / Self Care | Source: Ambulatory Visit | Attending: Orthopedic Surgery | Admitting: Orthopedic Surgery

## 2023-10-26 DIAGNOSIS — E041 Nontoxic single thyroid nodule: Secondary | ICD-10-CM

## 2023-10-27 ENCOUNTER — Ambulatory Visit: Payer: Self-pay

## 2023-10-27 NOTE — Telephone Encounter (Signed)
   Chief Complaint: abd pain Symptoms: abd pain and diarrhea Frequency: x 1 week Pertinent Negatives: Patient denies vomiting Disposition: [] ED /[] Urgent Care (no appt availability in office) / [x] Appointment(In office/virtual)/ []  [facility]/ [] Home Care/ [] Refused Recommended Disposition /[] [facility]/ []  Follow-up with PCP Additional Notes: Pt states that she went to [place] last week and started having abd cramps and diarrhea since 10/20/23.  States 3-4 watery stools a day. States abd pain 7/10 and comes and goes. States diarrhea has gotten better but not resolved.   Copied From CRM [id number]. Reason for Triage: Patient is calling she went out of country and now has diahreah and stomach cramps.   Reason for Disposition  [1] MODERATE pain (e.g., interferes with normal activities) AND [2] pain comes and goes (cramps) AND [3] present > 24 hours  (Exception: Pain with Vomiting or Diarrhea - see that Guideline.)  Answer Assessment - Initial Assessment Questions 1. LOCATION: "Where does it hurt?"      Lower abd 2. RADIATION: "Does the pain shoot anywhere else?" (e.g., chest, back)     no 3. ONSET: "When did the pain begin?" (e.g., minutes, hours or days ago)      10/20/23 4. SUDDEN: "Gradual or sudden onset?"     sudden 5. PATTERN "Does the pain come and go, or is it constant?"    - If it comes and goes: "How long does it last?" "Do you have pain now?"     (Note: Comes and goes means the pain is intermittent. It goes away completely between bouts.)    - If constant: "Is it getting better, staying the same, or getting worse?"      (Note: Constant means the pain never goes away completely; most serious pain is constant and gets worse.)      Comes and goes 6. SEVERITY: "How bad is the pain?"  (e.g., Scale 1-10; mild, moderate, or severe)    - MILD (1-3): Doesn't interfere with normal activities, abdomen soft and not tender to touch.     - MODERATE (4-7): Interferes  with normal activities or awakens from sleep, abdomen tender to touch.     - SEVERE (8-10): Excruciating pain, doubled over, unable to do any normal activities.       mod 7. RECURRENT SYMPTOM: "Have you ever had this type of stomach pain before?" If Yes, ask: "When was the last time?" and "What happened that time?"      no 8. CAUSE: "What do you think is causing the stomach pain?"     Traveled out the country 9. RELIEVING/AGGRAVATING FACTORS: "What makes it better or worse?" (e.g., antacids, bending or twisting motion, bowel movement)     pepto 10. OTHER SYMPTOMS: "Do you have any other symptoms?" (e.g., back pain, diarrhea, fever, urination pain, vomiting)       diarrhea  Protocols used: Abdominal Pain - Female-A-AH

## 2023-10-28 ENCOUNTER — Ambulatory Visit: Admitting: Family Medicine

## 2023-10-28 ENCOUNTER — Encounter: Payer: Self-pay | Admitting: Family Medicine

## 2023-10-28 VITALS — BP 122/60 | HR 68 | Temp 98.0°F | Ht 63.0 in | Wt 163.0 lb

## 2023-10-28 DIAGNOSIS — A09 Infectious gastroenteritis and colitis, unspecified: Secondary | ICD-10-CM

## 2023-10-28 NOTE — Patient Instructions (Signed)
 Continue supportive care. If you worsen, please let me know.  Take care  Dr. Debrah Fan

## 2023-10-30 LAB — GI PROFILE, STOOL, PCR

## 2023-10-31 ENCOUNTER — Ambulatory Visit: Payer: Self-pay | Admitting: Family Medicine

## 2023-10-31 DIAGNOSIS — A09 Infectious gastroenteritis and colitis, unspecified: Secondary | ICD-10-CM | POA: Insufficient documentation

## 2023-10-31 NOTE — Assessment & Plan Note (Signed)
 GI profile was obtained.  Positive for norovirus.  Supportive care.  Adequate hydration.  Should resolve spontaneously.  No need for further intervention.

## 2023-10-31 NOTE — Progress Notes (Signed)
 Subjective:  Patient ID: Kristi Medina, female    DOB: [birth year]  Age: 48 y.o. MRN: [id number]  CC:   Chief Complaint  Patient presents with   Diarrhea    Diarrhea and abdomen pain x's 1 week  Come and goes. Yesterday was as bad as day one.  Feels worse on an empty stomach  Recently in [place]      HPI:  48 year old female presents for evaluation of the above.  Recent travel to [place].  Returned back and has had ongoing diarrhea.  Associated abdominal pain.  No fever.  No hematochezia or melena.  Other individuals that she traveled with have had symptoms as well.  She has been taking Pepto-Bismol without resolution.  Patient Active Problem List   Diagnosis Date Noted   Infectious diarrhea 10/31/2023   Goiter 06/30/2021   Gastroesophageal reflux disease without esophagitis 11/24/2020   Peri-menopause 11/24/2020   Hypertension 06/18/2020   Abnormal Papanicolaou smear of cervix with positive human papilloma virus (HPV) test 06/22/2019   Palpitations 06/18/2019   Cough variant asthma vs UACS  08/09/2017   Constipation, chronic 08/22/2015   Allergic rhinitis 06/11/2010    Social Hx   Social History   Socioeconomic History   Marital status: Divorced    Spouse name: Not on file   Number of children: 2   Years of education: Not on file   Highest education level: Master's degree (e.g., MA, MS, MEng, MEd, MSW, MBA)  Occupational History   Occupation: Nurse    Employer: [employer]  Tobacco Use   Smoking status: Never   Smokeless tobacco: Never  Vaping Use   Vaping status: Never Used  Substance and Sexual Activity   Alcohol use: Not Currently    Comment: rare   Drug use: No   Sexual activity: Not Currently    Birth control/protection: Surgical, Pill    Comment: ablation and tubal  Other Topics Concern   Not on file  Social History Narrative   Regular exercise-no   Social Drivers of Health   Financial Resource Strain: Low Risk  (07/21/2023)   Overall  Financial Resource Strain (CARDIA)    Difficulty of Paying Living Expenses: Not hard at all  Food Insecurity: No Food Insecurity (07/21/2023)   Hunger Vital Sign    Worried About Running Out of Food in the Last Year: Never true    Ran Out of Food in the Last Year: Never true  Transportation Needs: No Transportation Needs (07/21/2023)   PRAPARE - Administrator, Civil Service (Medical): No    Lack of Transportation (Non-Medical): No  Physical Activity: Insufficiently Active (07/21/2023)   Exercise Vital Sign    Days of Exercise per Week: 1 day    Minutes of Exercise per Session: 20 min  Stress: No Stress Concern Present (07/21/2023)   [employer] of Occupational Health - Occupational Stress Questionnaire    Feeling of Stress : Not at all  Social Connections: Moderately Integrated (07/21/2023)   Social Connection and Isolation Panel [NHANES]    Frequency of Communication with Friends and Family: More than three times a week    Frequency of Social Gatherings with Friends and Family: Twice a week    Attends Religious Services: More than 4 times per year    Active Member of [employer] or Organizations: Yes    Attends Engineer, structural: More than 4 times per year    Marital Status: Divorced    Review of Systems Per HPI  Objective:  BP 122/60   Pulse 68   Temp 98 F (36.7 C)   Ht 5\' 3"  (1.6 m)   Wt 163 lb (73.9 kg)   SpO2 99%   BMI 28.87 kg/m      10/28/2023   10:33 AM 09/07/2023    2:25 PM 09/07/2023    2:15 PM  BP/Weight  Systolic BP 122 114 104  Diastolic BP 60 78 68  Wt. (Lbs) 163    BMI 28.87 kg/m2      Physical Exam Vitals and nursing note reviewed.  Constitutional:      General: She is not in acute distress. HENT:     Head: Normocephalic and atraumatic.  Cardiovascular:     Rate and Rhythm: Normal rate and regular rhythm.  Pulmonary:     Effort: Pulmonary effort is normal.     Breath sounds: Normal breath sounds. No wheezing or rales.   Abdominal:     General: There is no distension.     Palpations: Abdomen is soft.     Comments: Mild upper abdominal tenderness to palpation.  Neurological:     Mental Status: She is alert.     Lab Results  Component Value Date   WBC 8.4 07/21/2023   HGB 14.8 07/21/2023   HCT 43.8 07/21/2023   PLT 323 07/21/2023   GLUCOSE 91 07/21/2023   CHOL 168 07/21/2023   TRIG 197 (H) 07/21/2023   HDL 46 07/21/2023   LDLCALC 89 07/21/2023   ALT 25 07/21/2023   AST 21 07/21/2023   NA 143 07/21/2023   K 4.1 07/21/2023   CL 107 (H) 07/21/2023   CREATININE 1.07 (H) 07/21/2023   BUN 15 07/21/2023   CO2 23 07/21/2023   TSH 0.976 07/21/2023     Assessment & Plan:  Infectious diarrhea Assessment & Plan: GI profile was obtained.  Positive for norovirus.  Supportive care.  Adequate hydration.  Should resolve spontaneously.  No need for further intervention.  Orders: -     GI Profile, Stool, PCR    Follow-up:  Return if symptoms worsen or fail to improve.  Kathleen Papa DO [facility]

## 2023-11-11 ENCOUNTER — Encounter

## 2023-11-11 ENCOUNTER — Ambulatory Visit
Admission: RE | Admit: 2023-11-11 | Discharge: 2023-11-11 | Disposition: A | Discharge status: Home / Self Care | Source: Ambulatory Visit | Attending: Family Medicine | Admitting: Family Medicine

## 2023-11-11 DIAGNOSIS — Z1231 Encounter for screening mammogram for malignant neoplasm of breast: Secondary | ICD-10-CM

## 2024-07-04 ENCOUNTER — Other Ambulatory Visit: Payer: Self-pay | Admitting: Adult Health
